# Patient Record
Sex: Female | Born: 1970 | Race: White | Hispanic: No | Marital: Single | Smoking: Never smoker
Health system: Southern US, Community
[De-identification: ages and names within clinical notes are randomized; demographics above are authoritative.]

## PROBLEM LIST (undated history)

## (undated) DIAGNOSIS — IMO0002 Reserved for concepts with insufficient information to code with codable children: Secondary | ICD-10-CM

## (undated) DIAGNOSIS — F329 Major depressive disorder, single episode, unspecified: Secondary | ICD-10-CM

## (undated) DIAGNOSIS — N200 Calculus of kidney: Secondary | ICD-10-CM

## (undated) DIAGNOSIS — F32A Depression, unspecified: Secondary | ICD-10-CM

## (undated) DIAGNOSIS — D649 Anemia, unspecified: Secondary | ICD-10-CM

## (undated) DIAGNOSIS — K909 Intestinal malabsorption, unspecified: Secondary | ICD-10-CM

## (undated) DIAGNOSIS — M810 Age-related osteoporosis without current pathological fracture: Secondary | ICD-10-CM

## (undated) DIAGNOSIS — M549 Dorsalgia, unspecified: Secondary | ICD-10-CM

## (undated) DIAGNOSIS — G8929 Other chronic pain: Secondary | ICD-10-CM

## (undated) DIAGNOSIS — M1990 Unspecified osteoarthritis, unspecified site: Secondary | ICD-10-CM

## (undated) DIAGNOSIS — M419 Scoliosis, unspecified: Secondary | ICD-10-CM

## (undated) HISTORY — PX: TONSILLECTOMY: SUR1361

## (undated) HISTORY — PX: CHOLECYSTECTOMY: SHX55

## (undated) HISTORY — PX: ABDOMINAL HYSTERECTOMY: SHX81

## (undated) HISTORY — PX: ABDOMINAL SURGERY: SHX537

## (undated) HISTORY — DX: Age-related osteoporosis without current pathological fracture: M81.0

## (undated) HISTORY — PX: WRIST SURGERY: SHX841

## (undated) HISTORY — PX: BARIATRIC SURGERY: SHX1103

## (undated) HISTORY — PX: OTHER SURGICAL HISTORY: SHX169

---

## 2011-10-08 ENCOUNTER — Other Ambulatory Visit: Payer: Self-pay | Admitting: Internal Medicine

## 2011-10-08 DIAGNOSIS — M545 Low back pain, unspecified: Secondary | ICD-10-CM

## 2011-10-22 ENCOUNTER — Ambulatory Visit
Admission: RE | Admit: 2011-10-22 | Discharge: 2011-10-22 | Disposition: A | Discharge status: Home / Self Care | Payer: 59 | Source: Ambulatory Visit | Attending: Internal Medicine | Admitting: Internal Medicine

## 2011-10-22 DIAGNOSIS — M545 Low back pain, unspecified: Secondary | ICD-10-CM

## 2012-04-15 ENCOUNTER — Encounter: Payer: Self-pay | Admitting: *Deleted

## 2012-04-15 ENCOUNTER — Emergency Department
Admission: EM | Admit: 2012-04-15 | Discharge: 2012-04-15 | Disposition: A | Discharge status: Home / Self Care | Payer: 59 | Source: Home / Self Care

## 2012-04-15 DIAGNOSIS — S239XXA Sprain of unspecified parts of thorax, initial encounter: Secondary | ICD-10-CM

## 2012-04-15 HISTORY — DX: Anemia, unspecified: D64.9

## 2012-04-15 HISTORY — DX: Major depressive disorder, single episode, unspecified: F32.9

## 2012-04-15 HISTORY — DX: Unspecified osteoarthritis, unspecified site: M19.90

## 2012-04-15 HISTORY — DX: Reserved for concepts with insufficient information to code with codable children: IMO0002

## 2012-04-15 HISTORY — DX: Scoliosis, unspecified: M41.9

## 2012-04-15 HISTORY — DX: Depression, unspecified: F32.A

## 2012-04-15 MED ORDER — KETOROLAC TROMETHAMINE 60 MG/2ML IM SOLN
60.0000 mg | Freq: Once | INTRAMUSCULAR | Status: AC
  Administered 2012-04-15: 60 mg via INTRAMUSCULAR

## 2012-04-15 NOTE — ED Notes (Signed)
Pt c/o mid back pain x 2 days, after moving boxes @ her home.  She has taken tylenol for pain. She has an ortho appt on 04/29/12.

## 2012-04-15 NOTE — ED Provider Notes (Signed)
History     CSN: [id number]  Arrival date & time 04/15/12  1756   First MD Initiated Contact with Patient 04/15/12 1758      Chief Complaint  Patient presents with  . Back Pain  HPI Comments: Pt states that she has a previous history of degenerative disk disease and scoliosis. Pt states that she has an orthopeadic appt with Dr. Loralie Champagne in [place] on 04/29/12. Pt does not have a PCP. She has been getting Nucynta, percocet, and vicodin from her OB-GYN in [place]. Pt lives in [place]. Pt has been getting seen in urgent cares in the  area for her recurrent back pain.   Patient is a 41 y.o. female presenting with back pain.  Back Pain  This is a chronic problem. The current episode started 2 days ago. The problem has not changed since onset.The pain is associated with lifting heavy objects. The pain is present in the thoracic spine. The quality of the pain is described as aching. Radiates to: lower back  The pain is at a severity of 8/10. The pain is moderate. The symptoms are aggravated by certain positions. Pertinent negatives include no chest pain, no fever, no numbness, no abdominal pain, no perianal numbness, no bladder incontinence, no dysuria, no leg pain, no paresthesias, no tingling and no weakness. Treatments tried: tylenol  The treatment provided mild relief.    Past Medical History  Diagnosis Date  . Degenerative disk disease   . Scoliosis   . Arthritis   . Depression   . Anemia     Past Surgical History  Procedure Date  . Abdominal surgery     c-section  . Cholecystectomy   . Tonsillectomy   . Bariatric surgery   . Abdominal hysterectomy     Family History  Problem Relation Age of Onset  . Diabetes Father     History  Substance Use Topics  . Smoking status: Never Smoker   . Smokeless tobacco: Not on file  . Alcohol Use: No    OB History    Grav Para Term Preterm Abortions TAB SAB Ect Mult Living                  Review of Systems    Constitutional: Negative for fever.  Cardiovascular: Negative for chest pain.  Gastrointestinal: Negative for abdominal pain.  Genitourinary: Negative for bladder incontinence and dysuria.  Musculoskeletal: Positive for back pain.  Neurological: Negative for tingling, weakness, numbness and paresthesias.    Allergies  Nsaids and Tramadol  Home Medications   Current Outpatient Rx  Name Route Sig Dispense Refill  . CALCIUM + D PO Oral Take by mouth.    Marland Kitchen VITAMIN D3 3000 UNITS PO TABS Oral Take by mouth.    . ESCITALOPRAM OXALATE 10 MG PO TABS Oral Take 10 mg by mouth daily.      BP 141/87  Pulse 93  Temp(Src) 98.7 F (37.1 C) (Oral)  Resp 18  Ht 5\' 3"  (1.6 m)  Wt 202 lb 8 oz (91.853 kg)  BMI 35.87 kg/m2  SpO2 100%  Physical Exam  Constitutional: She appears well-developed and well-nourished.       In no acute distress   HENT:  Head: Normocephalic and atraumatic.  Eyes: Pupils are equal, round, and reactive to light.  Neck: Normal range of motion. Neck supple.  Cardiovascular: Normal rate and regular rhythm.   Pulmonary/Chest: Effort normal and breath sounds normal.  Musculoskeletal:  Arms: Neurological:       No focal neuro deficits, FABER negative    ED Course  Procedures (including critical care time)  Labs Reviewed - No data to display No results found.   No diagnosis found.    MDM  Thoracic back strain. No narcotic policy and narcotic database records discussed. Toradol 60mg  IM x 1 in clinic (in review of allergies, pt states that she was told not to take NSAIDs 2/2 to bariatric surgery 5 years ago. Pt has not taken NSAID since this point). Scheduled tylenol. Discussed prilosec use for next 48-72 hours for GI protection. Handout given. Follow up as needed.         Floydene Flock, MD 04/15/12 [phone]

## 2012-04-15 NOTE — ED Provider Notes (Signed)
History     CSN: [id number]  Arrival date & time 04/15/12  1756   First MD Initiated Contact with Patient 04/15/12 1758      Chief Complaint  Patient presents with  . Back Pain    (Consider location/radiation/quality/duration/timing/severity/associated sxs/prior treatment) HPI  Past Medical History  Diagnosis Date  . Degenerative disk disease   . Scoliosis   . Arthritis   . Depression   . Anemia     Past Surgical History  Procedure Date  . Abdominal surgery     c-section  . Cholecystectomy   . Tonsillectomy   . Bariatric surgery   . Abdominal hysterectomy     Family History  Problem Relation Age of Onset  . Diabetes Father     History  Substance Use Topics  . Smoking status: Never Smoker   . Smokeless tobacco: Not on file  . Alcohol Use: No    OB History    Grav Para Term Preterm Abortions TAB SAB Ect Mult Living                  Review of Systems  Allergies  Nsaids and Tramadol  Home Medications   Current Outpatient Rx  Name Route Sig Dispense Refill  . CALCIUM + D PO Oral Take by mouth.    Marland Kitchen VITAMIN D3 3000 UNITS PO TABS Oral Take by mouth.    . ESCITALOPRAM OXALATE 10 MG PO TABS Oral Take 10 mg by mouth daily.      BP 141/87  Pulse 93  Temp(Src) 98.7 F (37.1 C) (Oral)  Resp 18  Ht 5\' 3"  (1.6 m)  Wt 202 lb 8 oz (91.853 kg)  BMI 35.87 kg/m2  SpO2 100%  Physical Exam  ED Course  Procedures (including critical care time)  Labs Reviewed - No data to display No results found.   No diagnosis found.    MDM  Pt seen by Dr. Alvester Morin.  See his note for details.    Marlaine Hind, MD 04/15/12 [phone]

## 2012-04-15 NOTE — ED Provider Notes (Signed)
Pt seen by Dr. Alvester Morin.    Marlaine Hind, MD 04/15/12 [phone]

## 2012-04-15 NOTE — Discharge Instructions (Signed)
Thoracic Strain You have injured the muscles or tendons that attach to the upper part of your back behind your chest. This injury is called a thoracic strain, thoracic sprain, or mid-back strain.  CAUSES  The cause of thoracic strain varies. A less severe injury involves pulling a muscle or tendon without tearing it. A more severe injury involves tearing (rupturing) a muscle or tendon. With less severe injuries, there may be little loss of strength. Sometimes, there are breaks (fractures) in the bones to which the muscles are attached. These fractures are rare, unless there was a direct hit (trauma) or you have weak bones due to osteoporosis or age. Longstanding strains may be caused by overuse or improper form during certain movements. Obesity can also increase your risk for back injuries. Sudden strains may occur due to injury or not warming up properly before exercise. Often, there is no obvious cause for a thoracic strain. SYMPTOMS  The main symptom is pain, especially with movement, such as during exercise. DIAGNOSIS  Your caregiver can usually tell what is wrong by taking an X-ray and doing a physical exam. TREATMENT   Physical therapy may be helpful for recovery. Your caregiver can give you exercises to do or refer you to a physical therapist after your pain improves.   After your pain improves, strengthening and conditioning programs appropriate for your sport or occupation may be helpful.   Always warm up before physical activities or athletics. Stretching after physical activity may also help.   Certain over-the-counter medicines may also help. Ask your caregiver if there are medicines that would help you.  If this is your first thoracic strain injury, proper care and proper healing time before starting activities should prevent long-term problems. Torn ligaments and tendons require as long to heal as broken bones. Average healing times may be only 1 week for a mild strain. For torn  muscles and tendons, healing time may be up to 6 weeks to 2 months. HOME CARE INSTRUCTIONS   Apply ice to the injured area. Ice massages may also be used as directed.   Put ice in a plastic bag.   Place a towel between your skin and the bag.   Leave the ice on for 15 to 20 minutes, 3 to 4 times a day, for the first 2 days.   Only take over-the-counter or prescription medicines for pain, discomfort, or fever as directed by your caregiver.   Keep your appointments for physical therapy if this was prescribed.   Use wraps and back braces as instructed.  SEEK IMMEDIATE MEDICAL CARE IF:   You have an increase in bruising, swelling, or pain.   Your pain has not improved with medicines.   You develop new shortness of breath, chest pain, or fever.   Problems seem to be getting worse rather than better.  MAKE SURE YOU:   Understand these instructions.   Will watch your condition.   Will get help right away if you are not doing well or get worse.  Document Released: 02/27/2004 Document Revised: 11/26/2011 Document Reviewed: 01/23/2011 ExitCare Patient Information 2012 [employer]. 

## 2012-06-16 ENCOUNTER — Encounter (HOSPITAL_BASED_OUTPATIENT_CLINIC_OR_DEPARTMENT_OTHER): Payer: Self-pay | Admitting: Emergency Medicine

## 2012-06-16 ENCOUNTER — Emergency Department (HOSPITAL_BASED_OUTPATIENT_CLINIC_OR_DEPARTMENT_OTHER)
Admission: EM | Admit: 2012-06-16 | Discharge: 2012-06-16 | Disposition: A | Discharge status: Home / Self Care | Payer: 59 | Attending: Emergency Medicine | Admitting: Emergency Medicine

## 2012-06-16 ENCOUNTER — Emergency Department (HOSPITAL_BASED_OUTPATIENT_CLINIC_OR_DEPARTMENT_OTHER): Payer: 59

## 2012-06-16 DIAGNOSIS — Z79899 Other long term (current) drug therapy: Secondary | ICD-10-CM | POA: Insufficient documentation

## 2012-06-16 DIAGNOSIS — H9209 Otalgia, unspecified ear: Secondary | ICD-10-CM | POA: Insufficient documentation

## 2012-06-16 DIAGNOSIS — R062 Wheezing: Secondary | ICD-10-CM | POA: Insufficient documentation

## 2012-06-16 DIAGNOSIS — N390 Urinary tract infection, site not specified: Secondary | ICD-10-CM

## 2012-06-16 LAB — URINE MICROSCOPIC-ADD ON

## 2012-06-16 LAB — URINALYSIS, ROUTINE W REFLEX MICROSCOPIC
Glucose, UA: NEGATIVE mg/dL
Ketones, ur: NEGATIVE mg/dL
pH: 5.5 (ref 5.0–8.0)

## 2012-06-16 MED ORDER — ALBUTEROL SULFATE (5 MG/ML) 0.5% IN NEBU
5.0000 mg | INHALATION_SOLUTION | Freq: Once | RESPIRATORY_TRACT | Status: AC
  Administered 2012-06-16: 5 mg via RESPIRATORY_TRACT
  Filled 2012-06-16: qty 1

## 2012-06-16 MED ORDER — HYDROCOD POLST-CHLORPHEN POLST 10-8 MG/5ML PO LQCR: 5.0000 mL | Freq: Two times a day (BID) | ORAL | Status: DC | PRN

## 2012-06-16 MED ORDER — IPRATROPIUM BROMIDE 0.02 % IN SOLN
0.5000 mg | Freq: Once | RESPIRATORY_TRACT | Status: AC
  Administered 2012-06-16: 0.5 mg via RESPIRATORY_TRACT
  Filled 2012-06-16: qty 2.5

## 2012-06-16 MED ORDER — SULFAMETHOXAZOLE-TRIMETHOPRIM 800-160 MG PO TABS: 1.0000 | ORAL_TABLET | Freq: Two times a day (BID) | ORAL | Status: AC

## 2012-06-16 MED ORDER — ANTIPYRINE-BENZOCAINE 5.4-1.4 % OT SOLN
3.0000 [drp] | Freq: Once | OTIC | Status: AC
  Administered 2012-06-16: 3 [drp] via OTIC
  Filled 2012-06-16: qty 10

## 2012-06-16 NOTE — ED Notes (Signed)
Pt diagnosed with bronchitis several weeks ago.  Continues to have symptoms of coughing.  Pt states her right ear hurts x 1 week and she is having left upper abdominal pain since yesterday.  No N/V/D.  Some chills.

## 2012-06-16 NOTE — Discharge Instructions (Signed)

## 2012-06-16 NOTE — ED Provider Notes (Signed)
Medical screening examination/treatment/procedure(s) were performed by non-physician practitioner and as supervising physician I was immediately available for consultation/collaboration.  Orvin Netter T Gearline Spilman, MD 06/16/12 2317 

## 2012-06-16 NOTE — ED Provider Notes (Signed)
History     CSN: [id number]  Arrival date & time 06/16/12  1726   First MD Initiated Contact with Patient 06/16/12 1825      Chief Complaint  Patient presents with  . Abdominal Pain  . Otalgia    (Consider location/radiation/quality/duration/timing/severity/associated sxs/prior treatment) HPI Comments: Pt states that she has been on a z-pack and levaquin for bronchitis:pt states that she gets intermittent relief but then the cough and wheezing seem to come back:pt states that he is now having left lower rib pain:pt states that she has not had a recent fever, but she has had chills  Patient is a 41 y.o. female presenting with cough and ear pain. The history is provided by the patient. No language interpreter was used.  Cough This is a new problem. The current episode started more than 1 week ago. The problem occurs hourly. The problem has not changed since onset.The cough is productive of sputum. The maximum temperature recorded prior to her arrival was 100 to 100.9 F. Associated symptoms include ear pain and wheezing. Pertinent negatives include no sore throat. Her past medical history is significant for bronchitis.  Otalgia This is a new problem. The current episode started more than 1 week ago. There is pain in the right ear. The problem occurs constantly. The problem has not changed since onset.Associated symptoms include cough. Pertinent negatives include no sore throat.    Past Medical History  Diagnosis Date  . Degenerative disk disease   . Scoliosis   . Arthritis   . Depression   . Anemia     Past Surgical History  Procedure Date  . Abdominal surgery     c-section  . Cholecystectomy   . Tonsillectomy   . Bariatric surgery   . Abdominal hysterectomy     Family History  Problem Relation Age of Onset  . Diabetes Father     History  Substance Use Topics  . Smoking status: Never Smoker   . Smokeless tobacco: Not on file  . Alcohol Use: No    OB History    Grav Para Term Preterm Abortions TAB SAB Ect Mult Living                  Review of Systems  Constitutional: Negative.   HENT: Positive for ear pain. Negative for sore throat.   Respiratory: Positive for cough and wheezing.   Cardiovascular: Negative.     Allergies  Nsaids and Tramadol  Home Medications   Current Outpatient Rx  Name Route Sig Dispense Refill  . AZITHROMYCIN 250 MG PO TABS Oral Take 250 mg by mouth daily.    Marland Kitchen CALCIUM + D PO Oral Take by mouth.    Marland Kitchen VITAMIN D3 3000 UNITS PO TABS Oral Take by mouth.    Marland Kitchen CIPROFLOXACIN HCL 500 MG PO TABS Oral Take 500 mg by mouth 2 (two) times daily.    Marland Kitchen ESCITALOPRAM OXALATE 10 MG PO TABS Oral Take 10 mg by mouth daily.    Marland Kitchen HYDROCODONE-ACETAMINOPHEN 5-325 MG PO TABS Oral Take 1 tablet by mouth every 6 (six) hours as needed. Patient used this medication for pain.    Marland Kitchen PREDNISONE 20 MG PO TABS Oral Take 20 mg by mouth daily.      BP 132/82  Pulse 77  Temp 98.6 F (37 C) (Oral)  Resp 22  Ht 5\' 3"  (1.6 m)  Wt 200 lb (90.719 kg)  BMI 35.43 kg/m2  SpO2 100%  Physical Exam  Nursing  note and vitals reviewed. Constitutional: She is oriented to person, place, and time. She appears well-developed and well-nourished.  HENT:  Head: Normocephalic and atraumatic.  Right Ear: External ear normal.  Left Ear: External ear normal.  Mouth/Throat: Oropharynx is clear and moist.  Eyes: EOM are normal.  Neck: Neck supple.  Cardiovascular: Normal rate and regular rhythm.   Pulmonary/Chest: Effort normal.       Pt tender in the left lower ribs  Musculoskeletal: Normal range of motion.  Neurological: She is alert and oriented to person, place, and time.  Skin: Skin is warm and dry.  Psychiatric: She has a normal mood and affect.    ED Course  Procedures (including critical care time)  Labs Reviewed  URINALYSIS, ROUTINE W REFLEX MICROSCOPIC - Abnormal; Notable for the following:    APPearance CLOUDY (*)     Hgb urine dipstick SMALL  (*)     Nitrite POSITIVE (*)     Leukocytes, UA LARGE (*)     All other components within normal limits  URINE MICROSCOPIC-ADD ON - Abnormal; Notable for the following:    Squamous Epithelial / LPF FEW (*)     Bacteria, UA FEW (*)     All other components within normal limits   Dg Chest 2 View  06/16/2012  *RADIOLOGY REPORT*  Clinical Data: Cough, left rib pain  CHEST - 2 VIEW  Comparison: 12/15/1999 set  Findings: Normal heart size, mediastinal contours, and pulmonary vascularity. Lungs clear. No pleural effusion or pneumothorax. No acute osseous abnormalities identified.  IMPRESSION: No acute abnormalities.  Original Report Authenticated By: Lollie Marrow, M.D.     1. UTI (lower urinary tract infection)   2. Otalgia       MDM  Pt abdomen is benign:simple ZOX:WRUE treat symptomatically for the cough        Teressa Lower, NP 06/16/12 2010

## 2012-06-18 LAB — URINE CULTURE: Colony Count: >=100000

## 2012-06-19 NOTE — ED Notes (Signed)
Results received from [facility]. (+) URNC -> >/= 100,000 colonies E Coli.  Rx given in ED for Sulfa-Trimeth -> sensitive to the same.  Chart appended per protocol.

## 2014-08-06 ENCOUNTER — Emergency Department (HOSPITAL_BASED_OUTPATIENT_CLINIC_OR_DEPARTMENT_OTHER)
Admission: EM | Admit: 2014-08-06 | Discharge: 2014-08-06 | Disposition: A | Discharge status: Home / Self Care | Payer: Self-pay | Attending: Emergency Medicine | Admitting: Emergency Medicine

## 2014-08-06 ENCOUNTER — Emergency Department (HOSPITAL_BASED_OUTPATIENT_CLINIC_OR_DEPARTMENT_OTHER): Payer: 59

## 2014-08-06 ENCOUNTER — Encounter (HOSPITAL_BASED_OUTPATIENT_CLINIC_OR_DEPARTMENT_OTHER): Payer: Self-pay | Admitting: Emergency Medicine

## 2014-08-06 DIAGNOSIS — Z90710 Acquired absence of both cervix and uterus: Secondary | ICD-10-CM | POA: Insufficient documentation

## 2014-08-06 DIAGNOSIS — F3289 Other specified depressive episodes: Secondary | ICD-10-CM | POA: Insufficient documentation

## 2014-08-06 DIAGNOSIS — IMO0002 Reserved for concepts with insufficient information to code with codable children: Secondary | ICD-10-CM | POA: Insufficient documentation

## 2014-08-06 DIAGNOSIS — F329 Major depressive disorder, single episode, unspecified: Secondary | ICD-10-CM | POA: Insufficient documentation

## 2014-08-06 DIAGNOSIS — Z9884 Bariatric surgery status: Secondary | ICD-10-CM | POA: Insufficient documentation

## 2014-08-06 DIAGNOSIS — Z9089 Acquired absence of other organs: Secondary | ICD-10-CM | POA: Insufficient documentation

## 2014-08-06 DIAGNOSIS — M4120 Other idiopathic scoliosis, site unspecified: Secondary | ICD-10-CM | POA: Insufficient documentation

## 2014-08-06 DIAGNOSIS — Z79899 Other long term (current) drug therapy: Secondary | ICD-10-CM | POA: Insufficient documentation

## 2014-08-06 DIAGNOSIS — R112 Nausea with vomiting, unspecified: Secondary | ICD-10-CM | POA: Insufficient documentation

## 2014-08-06 DIAGNOSIS — M129 Arthropathy, unspecified: Secondary | ICD-10-CM | POA: Insufficient documentation

## 2014-08-06 DIAGNOSIS — Z862 Personal history of diseases of the blood and blood-forming organs and certain disorders involving the immune mechanism: Secondary | ICD-10-CM | POA: Insufficient documentation

## 2014-08-06 DIAGNOSIS — Z9889 Other specified postprocedural states: Secondary | ICD-10-CM | POA: Insufficient documentation

## 2014-08-06 DIAGNOSIS — Z792 Long term (current) use of antibiotics: Secondary | ICD-10-CM | POA: Insufficient documentation

## 2014-08-06 DIAGNOSIS — R109 Unspecified abdominal pain: Secondary | ICD-10-CM | POA: Insufficient documentation

## 2014-08-06 LAB — CBC WITH DIFFERENTIAL/PLATELET
BASOS ABS: 0 10*3/uL (ref 0.0–0.1)
BASOS PCT: 1 % (ref 0–1)
EOS ABS: 0.1 10*3/uL (ref 0.0–0.7)
Eosinophils Relative: 2 % (ref 0–5)
HEMATOCRIT: 39.3 % (ref 36.0–46.0)
HEMOGLOBIN: 12.9 g/dL (ref 12.0–15.0)
Lymphocytes Relative: 28 % (ref 12–46)
Lymphs Abs: 1.8 10*3/uL (ref 0.7–4.0)
MCH: 29.3 pg (ref 26.0–34.0)
MCHC: 32.8 g/dL (ref 30.0–36.0)
MCV: 89.3 fL (ref 78.0–100.0)
MONO ABS: 0.5 10*3/uL (ref 0.1–1.0)
MONOS PCT: 7 % (ref 3–12)
NEUTROS ABS: 4.1 10*3/uL (ref 1.7–7.7)
Neutrophils Relative %: 63 % (ref 43–77)
Platelets: 255 10*3/uL (ref 150–400)
RBC: 4.4 MIL/uL (ref 3.87–5.11)
RDW: 13.3 % (ref 11.5–15.5)
WBC: 6.5 10*3/uL (ref 4.0–10.5)

## 2014-08-06 LAB — URINALYSIS, ROUTINE W REFLEX MICROSCOPIC
Bilirubin Urine: NEGATIVE
GLUCOSE, UA: NEGATIVE mg/dL
Ketones, ur: NEGATIVE mg/dL
Nitrite: NEGATIVE
PH: 6 (ref 5.0–8.0)
Protein, ur: NEGATIVE mg/dL
SPECIFIC GRAVITY, URINE: 1.025 (ref 1.005–1.030)
Urobilinogen, UA: 0.2 mg/dL (ref 0.0–1.0)

## 2014-08-06 LAB — URINE MICROSCOPIC-ADD ON

## 2014-08-06 LAB — BASIC METABOLIC PANEL
ANION GAP: 12 (ref 5–15)
BUN: 10 mg/dL (ref 6–23)
CALCIUM: 8.7 mg/dL (ref 8.4–10.5)
CHLORIDE: 106 meq/L (ref 96–112)
CO2: 24 mEq/L (ref 19–32)
CREATININE: 0.5 mg/dL (ref 0.50–1.10)
GFR calc non Af Amer: >90 mL/min (ref >90)
Glucose, Bld: 95 mg/dL (ref 70–99)
Potassium: 3.8 mEq/L (ref 3.7–5.3)
Sodium: 142 mEq/L (ref 137–147)

## 2014-08-06 MED ORDER — ONDANSETRON HCL 4 MG/2ML IJ SOLN
INTRAMUSCULAR | Status: DC
Start: 2014-08-06 — End: 2014-08-07
  Filled 2014-08-06: qty 2

## 2014-08-06 MED ORDER — IOHEXOL 300 MG/ML  SOLN
100.0000 mL | Freq: Once | INTRAMUSCULAR | Status: AC | PRN
Start: 2014-08-06 — End: 2014-08-06
  Administered 2014-08-06: 100 mL via INTRAVENOUS

## 2014-08-06 MED ORDER — ONDANSETRON HCL 4 MG/2ML IJ SOLN
4.0000 mg | Freq: Once | INTRAMUSCULAR | Status: AC
  Administered 2014-08-06: 4 mg via INTRAVENOUS

## 2014-08-06 MED ORDER — OXYCODONE-ACETAMINOPHEN 5-325 MG PO TABS
1.0000 | ORAL_TABLET | Freq: Once | ORAL | Status: AC
  Administered 2014-08-06: 1 via ORAL
  Filled 2014-08-06: qty 1

## 2014-08-06 MED ORDER — HYDROMORPHONE HCL PF 1 MG/ML IJ SOLN
0.5000 mg | Freq: Once | INTRAMUSCULAR | Status: AC
Start: 2014-08-06 — End: 2014-08-06
  Administered 2014-08-06: 0.5 mg via INTRAVENOUS
  Filled 2014-08-06: qty 1

## 2014-08-06 MED ORDER — IOHEXOL 300 MG/ML  SOLN
50.0000 mL | Freq: Once | INTRAMUSCULAR | Status: AC | PRN
  Administered 2014-08-06: 50 mL via ORAL

## 2014-08-06 MED ORDER — HYDROMORPHONE HCL PF 1 MG/ML IJ SOLN
1.0000 mg | Freq: Once | INTRAMUSCULAR | Status: AC
Start: 2014-08-06 — End: 2014-08-06
  Administered 2014-08-06: 1 mg via INTRAVENOUS
  Filled 2014-08-06: qty 1

## 2014-08-06 MED ORDER — ONDANSETRON HCL 4 MG PO TABS: 4.0000 mg | ORAL_TABLET | Freq: Four times a day (QID) | ORAL | Status: DC

## 2014-08-06 MED ORDER — OXYCODONE-ACETAMINOPHEN 5-325 MG PO TABS: 1.0000 | ORAL_TABLET | ORAL | Status: DC | PRN

## 2014-08-06 MED ORDER — PROMETHAZINE HCL 25 MG PO TABS: 25.0000 mg | ORAL_TABLET | Freq: Four times a day (QID) | ORAL | Status: DC | PRN

## 2014-08-06 NOTE — Discharge Instructions (Signed)
Flank Pain °Flank pain refers to pain that is located on the side of the body between the upper abdomen and the back. The pain may occur over a short period of time (acute) or may be long-term or reoccurring (chronic). It may be mild or severe. Flank pain can be caused by many things. °CAUSES  °Some of the more common causes of flank pain include: °· Muscle strains.   °· Muscle spasms.   °· A disease of your spine (vertebral disk disease).   °· A lung infection (pneumonia).   °· Fluid around your lungs (pulmonary edema).   °· A kidney infection.   °· Kidney stones.   °· A very painful skin rash caused by the chickenpox virus (shingles).   °· Gallbladder disease.   °HOME CARE INSTRUCTIONS  °Home care will depend on the cause of your pain. In general, °· Rest as directed by your caregiver. °· Drink enough fluids to keep your urine clear or pale yellow. °· Only take over-the-counter or prescription medicines as directed by your caregiver. Some medicines may help relieve the pain. °· Tell your caregiver about any changes in your pain. °· Follow up with your caregiver as directed. °SEEK IMMEDIATE MEDICAL CARE IF:  °· Your pain is not controlled with medicine.   °· You have new or worsening symptoms. °· Your pain increases.   °· You have abdominal pain.   °· You have shortness of breath.   °· You have persistent nausea or vomiting.   °· You have swelling in your abdomen.   °· You feel faint or pass out.   °· You have blood in your urine. °· You have a fever or persistent symptoms for more than 2-3 days. °· You have a fever and your symptoms suddenly get worse. °MAKE SURE YOU:  °· Understand these instructions. °· Will watch your condition. °· Will get help right away if you are not doing well or get worse. °Document Released: 01/28/2006 Document Revised: 08/31/2012 Document Reviewed: 07/21/2012 °ExitCare® Patient Information ©2015 [employer]. This information is not intended to replace advice given to you by your  health care provider. Make sure you discuss any questions you have with your health care provider. ° °

## 2014-08-06 NOTE — ED Provider Notes (Signed)
Medical screening examination/treatment/procedure(s) were conducted as a shared visit with non-physician practitioner(s) and myself.  I personally evaluated the patient during the encounter.   EKG Interpretation None       Doug SouSam Mikell Kazlauskas, MD 08/06/14 2328

## 2014-08-06 NOTE — ED Notes (Signed)
Pt c/o bil flank pain x 1 day denies UTI symptoms

## 2014-08-06 NOTE — ED Provider Notes (Signed)
Complains of bilateral flank pain, right greater than left. Patient last vomited 2 days ago. This likely B. infection she's had in the past. Eye exam nontoxic alert lungs clear auscultation heart regular rate and rhythm abdomen obese, nontender positive left flank tenderness no right flank tenderness extremities no edema skin warm dry no rash  Doug SouSam Eldwin Volkov, MD 08/06/14 1932

## 2014-08-06 NOTE — ED Provider Notes (Signed)
CSN: [id number]     Arrival date & time 08/06/14  1702 History   First MD Initiated Contact with Patient 08/06/14 1731     Chief Complaint  Patient presents with  . Flank Pain     (Consider location/radiation/quality/duration/timing/severity/associated sxs/prior Treatment) Patient is a 43 y.o. female presenting with flank pain. The history is provided by the patient. No language interpreter was used.  Flank Pain This is a new problem. Associated symptoms include nausea and vomiting. Pertinent negatives include no abdominal pain, chills, coughing or fever. Associated symptoms comments: She complains of bilateral flank pain for the past 2 weeks that started as mild discomfort and, today, became significant pain. No fever. She denies urinary symptoms of dysuria or frequency. No known injury. No abdominal pain..    Past Medical History  Diagnosis Date  . Degenerative disk disease   . Scoliosis   . Arthritis   . Depression   . Anemia    Past Surgical History  Procedure Laterality Date  . Abdominal surgery      c-section  . Cholecystectomy    . Tonsillectomy    . Bariatric surgery    . Abdominal hysterectomy     Family History  Problem Relation Age of Onset  . Diabetes Father    History  Substance Use Topics  . Smoking status: Never Smoker   . Smokeless tobacco: Not on file  . Alcohol Use: No   OB History   Grav Para Term Preterm Abortions TAB SAB Ect Mult Living                 Review of Systems  Constitutional: Negative for fever and chills.  Respiratory: Negative.  Negative for cough.   Gastrointestinal: Positive for nausea and vomiting. Negative for abdominal pain and diarrhea.  Genitourinary: Positive for flank pain. Negative for dysuria and frequency.  Musculoskeletal: Negative.   Skin: Negative.   Neurological: Negative.       Allergies  Nsaids and Tramadol  Home Medications   Prior to Admission medications   Medication Sig Start Date End Date Taking?  Authorizing Provider  azithromycin (ZITHROMAX) 250 MG tablet Take 250 mg by mouth daily.    Historical Provider, MD  Calcium Carbonate-Vitamin D (CALCIUM + D PO) Take by mouth.    Historical Provider, MD  chlorpheniramine-HYDROcodone (TUSSIONEX PENNKINETIC ER) 10-8 MG/5ML LQCR Take 5 mLs by mouth every 12 (twelve) hours as needed. 06/16/12   Teressa Lower, NP  Cholecalciferol (VITAMIN D3) 3000 UNITS TABS Take by mouth.    Historical Provider, MD  ciprofloxacin (CIPRO) 500 MG tablet Take 500 mg by mouth 2 (two) times daily.    Historical Provider, MD  escitalopram (LEXAPRO) 10 MG tablet Take 10 mg by mouth daily.    Historical Provider, MD  HYDROcodone-acetaminophen (NORCO) 5-325 MG per tablet Take 1 tablet by mouth every 6 (six) hours as needed. Patient used this medication for pain.    Historical Provider, MD  predniSONE (DELTASONE) 20 MG tablet Take 20 mg by mouth daily.    Historical Provider, MD   BP 135/55  Pulse 73  Temp(Src) 98.4 F (36.9 C) (Oral)  Resp 18  Ht 5\' 3"  (1.6 m)  Wt 210 lb (95.255 kg)  BMI 37.21 kg/m2  SpO2 100% Physical Exam  Constitutional: She is oriented to person, place, and time. She appears well-developed and well-nourished. No distress.  Pulmonary/Chest: Effort normal.  Abdominal: Soft. There is no tenderness. There is no rebound and no guarding.  Genitourinary:  Bilateral flank tenderness.   Neurological: She is alert and oriented to person, place, and time. Coordination normal.  Skin: Skin is warm and dry. No rash noted. No erythema.  Psychiatric: She has a normal mood and affect.    ED Course  Procedures (including critical care time) Labs Review Labs Reviewed  URINALYSIS, ROUTINE W REFLEX MICROSCOPIC - Abnormal; Notable for the following:    Hgb urine dipstick TRACE (*)    Leukocytes, UA SMALL (*)    All other components within normal limits  URINE MICROSCOPIC-ADD ON - Abnormal; Notable for the following:    Crystals CA OXALATE CRYSTALS (*)     All other components within normal limits  BASIC METABOLIC PANEL    Imaging Review No results found.   EKG Interpretation None      MDM   Final diagnoses:  None    1. Flank pain  Pain is significantly improved in ED with medications. No concerning cause for flank pain identified. No UTI - Ca Oxalate stones seen but nephrolithiasis unlikely with bilateral flank pain and no hematuria. No fever to suggest pyelonephritis. No abdominal pain that would be associated with CT findings of gastritis/duodenitis. VSS. Will provide pain management and referral to urology requested by the patient.    Arnoldo HookerShari A Million Maharaj, PA-C 08/06/14 2205

## 2014-08-06 NOTE — ED Notes (Signed)
CT notified pt has completed contrast. 

## 2014-09-20 ENCOUNTER — Encounter (HOSPITAL_BASED_OUTPATIENT_CLINIC_OR_DEPARTMENT_OTHER): Payer: Self-pay | Admitting: Emergency Medicine

## 2014-09-20 ENCOUNTER — Emergency Department (HOSPITAL_BASED_OUTPATIENT_CLINIC_OR_DEPARTMENT_OTHER)
Admission: EM | Admit: 2014-09-20 | Discharge: 2014-09-20 | Disposition: A | Discharge status: Home / Self Care | Payer: 59 | Attending: Emergency Medicine | Admitting: Emergency Medicine

## 2014-09-20 ENCOUNTER — Emergency Department (HOSPITAL_BASED_OUTPATIENT_CLINIC_OR_DEPARTMENT_OTHER): Payer: 59

## 2014-09-20 DIAGNOSIS — S60221A Contusion of right hand, initial encounter: Secondary | ICD-10-CM | POA: Insufficient documentation

## 2014-09-20 DIAGNOSIS — Y9389 Activity, other specified: Secondary | ICD-10-CM | POA: Insufficient documentation

## 2014-09-20 DIAGNOSIS — Z7952 Long term (current) use of systemic steroids: Secondary | ICD-10-CM | POA: Insufficient documentation

## 2014-09-20 DIAGNOSIS — W230XXA Caught, crushed, jammed, or pinched between moving objects, initial encounter: Secondary | ICD-10-CM | POA: Insufficient documentation

## 2014-09-20 DIAGNOSIS — F329 Major depressive disorder, single episode, unspecified: Secondary | ICD-10-CM | POA: Insufficient documentation

## 2014-09-20 DIAGNOSIS — Z862 Personal history of diseases of the blood and blood-forming organs and certain disorders involving the immune mechanism: Secondary | ICD-10-CM | POA: Insufficient documentation

## 2014-09-20 DIAGNOSIS — M1990 Unspecified osteoarthritis, unspecified site: Secondary | ICD-10-CM | POA: Insufficient documentation

## 2014-09-20 DIAGNOSIS — Y929 Unspecified place or not applicable: Secondary | ICD-10-CM | POA: Insufficient documentation

## 2014-09-20 DIAGNOSIS — Z792 Long term (current) use of antibiotics: Secondary | ICD-10-CM | POA: Insufficient documentation

## 2014-09-20 DIAGNOSIS — Z79899 Other long term (current) drug therapy: Secondary | ICD-10-CM | POA: Insufficient documentation

## 2014-09-20 DIAGNOSIS — Z87442 Personal history of urinary calculi: Secondary | ICD-10-CM | POA: Insufficient documentation

## 2014-09-20 HISTORY — DX: Calculus of kidney: N20.0

## 2014-09-20 MED ORDER — HYDROCODONE-ACETAMINOPHEN 5-325 MG PO TABS: 1.0000 | ORAL_TABLET | Freq: Four times a day (QID) | ORAL | Status: DC | PRN

## 2014-09-20 NOTE — ED Provider Notes (Signed)
CSN: [id number]     Arrival date & time 09/20/14  1814 History  This chart was scribed for Shon Batonourtney F Horton, MD by Jolene Provostobert Halas, ED Scribe. This patient was seen in room MH05/MH05 and the patient's care was started at 6:51 PM.    Chief Complaint  Patient presents with  . Hand Injury   HPI HPI Comments: Catherine Gallegos is a 43 y.o. female who presents to the Emergency Department complaining of dull, achy right hand pain as well as bruising that began this morning when her 60 pound pitt bull fell against her hand smashing it into a closing door. Pt states that her pain is a 6-7/10. Pt is using tylenol at home for pain without relief. Pt states that she is allergic to tramadol and Ibuprofen. Pt is right handed.   Past Medical History  Diagnosis Date  . Degenerative disk disease   . Scoliosis   . Arthritis   . Depression   . Anemia   . Kidney stone    Past Surgical History  Procedure Laterality Date  . Abdominal surgery      c-section  . Cholecystectomy    . Tonsillectomy    . Bariatric surgery    . Abdominal hysterectomy     Family History  Problem Relation Age of Onset  . Diabetes Father    History  Substance Use Topics  . Smoking status: Never Smoker   . Smokeless tobacco: Not on file  . Alcohol Use: No   OB History   Grav Para Term Preterm Abortions TAB SAB Ect Mult Living                 Review of Systems  Musculoskeletal:       Right hand pain  Skin: Positive for wound.  All other systems reviewed and are negative.     Allergies  Nsaids and Tramadol  Home Medications   Prior to Admission medications   Medication Sig Start Date End Date Taking? Authorizing Provider  azithromycin (ZITHROMAX) 250 MG tablet Take 250 mg by mouth daily.    Historical Provider, MD  Calcium Carbonate-Vitamin D (CALCIUM + D PO) Take by mouth.    Historical Provider, MD  chlorpheniramine-HYDROcodone (TUSSIONEX PENNKINETIC ER) 10-8 MG/5ML LQCR Take 5 mLs by mouth every 12 (twelve)  hours as needed. 06/16/12   Teressa LowerVrinda Pickering, NP  Cholecalciferol (VITAMIN D3) 3000 UNITS TABS Take by mouth.    Historical Provider, MD  ciprofloxacin (CIPRO) 500 MG tablet Take 500 mg by mouth 2 (two) times daily.    Historical Provider, MD  escitalopram (LEXAPRO) 10 MG tablet Take 10 mg by mouth daily.    Historical Provider, MD  HYDROcodone-acetaminophen (NORCO) 5-325 MG per tablet Take 1 tablet by mouth every 6 (six) hours as needed. Patient used this medication for pain.    Historical Provider, MD  HYDROcodone-acetaminophen (NORCO/VICODIN) 5-325 MG per tablet Take 1 tablet by mouth every 6 (six) hours as needed for moderate pain or severe pain. 09/20/14   Shon Batonourtney F Horton, MD  ondansetron (ZOFRAN) 4 MG tablet Take 1 tablet (4 mg total) by mouth every 6 (six) hours. 08/06/14   Shari A Upstill, PA-C  oxyCODONE-acetaminophen (PERCOCET/ROXICET) 5-325 MG per tablet Take 1-2 tablets by mouth every 4 (four) hours as needed for severe pain. 08/06/14   Shari A Upstill, PA-C  predniSONE (DELTASONE) 20 MG tablet Take 20 mg by mouth daily.    Historical Provider, MD  promethazine (PHENERGAN) 25 MG tablet Take 1  tablet (25 mg total) by mouth every 6 (six) hours as needed for nausea or vomiting. 08/06/14   Melvenia Beam A Upstill, PA-C   BP 143/91  Pulse 87  Temp(Src) 98.7 F (37.1 C) (Oral)  Resp 18  Ht 5\' 3"  (1.6 m)  Wt 210 lb (95.255 kg)  BMI 37.21 kg/m2  SpO2 100% Physical Exam  Nursing note and vitals reviewed. Constitutional: She is oriented to person, place, and time. She appears well-developed and well-nourished. No distress.  HENT:  Head: Normocephalic and atraumatic.  Cardiovascular: Normal rate and regular rhythm.   Pulmonary/Chest: Effort normal. No respiratory distress.  Musculoskeletal:  Focused examination of the right hand reveals bruising over the dorsum of the hand with tenderness to palpation over the second, third, and fourth metacarpals, no snuffbox tenderness, full range of motion of  all fingers and wrists, no obvious deformity, 2+ radial pulse  Neurological: She is alert and oriented to person, place, and time.  Skin: Skin is warm and dry.  Psychiatric: She has a normal mood and affect.    ED Course  Procedures (including critical care time) Labs Review Labs Reviewed - No data to display  Imaging Review Dg Hand Complete Right  09/20/2014   CLINICAL DATA:  43 year old female with hand pain.  Previous injury.  EXAM: RIGHT HAND - COMPLETE 3+ VIEW  COMPARISON:  02/14/2010  FINDINGS: No acute bony abnormality. No significant soft tissue swelling. No radiopaque foreign body. Carpal bones remain aligned. Progressive changes of osteoarthritis, particularly of the first MCP, and the PIP of the index finger.  IMPRESSION: No acute bony abnormality identified.  Signed,  Yvone Neu. Loreta Ave, DO  [facility]  [facility]   Electronically Signed   By: Gilmer Mor O.D.   On: 09/20/2014 19:42     EKG Interpretation None      MDM   Final diagnoses:  Hand contusion, right, initial encounter    Patient presents with injury to right hand. She has obvious bruising of the dorsum of the hand. No obvious deformity. No indication of snuffbox tenderness her occult scaphoid fracture. Plain films are negative. Discuss with patient continuing icing at home and that this is likely a bad contusion. Will provide a short course of Norco given intolerance to ibuprofen. She was instructed not to take this with additional Tylenol.  After history, exam, and medical workup I feel the patient has been appropriately medically screened and is safe for discharge home. Pertinent diagnoses were discussed with the patient. Patient was given return precautions.   I personally performed the services described in this documentation, which was scribed in my presence. The recorded information has been reviewed and is accurate.    Shon Baton, MD 09/20/14  [phone]

## 2014-09-20 NOTE — ED Notes (Signed)
Pt right hand was caught between dog and doorway this am-bruising/swelling noted

## 2014-09-20 NOTE — Discharge Instructions (Signed)
Contusion °A contusion is a deep bruise. Contusions are the result of an injury that caused bleeding under the skin. The contusion may turn blue, purple, or yellow. Minor injuries will give you a painless contusion, but more severe contusions may stay painful and swollen for a few weeks.  °CAUSES  °A contusion is usually caused by a blow, trauma, or direct force to an area of the body. °SYMPTOMS  °· Swelling and redness of the injured area. °· Bruising of the injured area. °· Tenderness and soreness of the injured area. °· Pain. °DIAGNOSIS  °The diagnosis can be made by taking a history and physical exam. An X-ray, CT scan, or MRI may be needed to determine if there were any associated injuries, such as fractures. °TREATMENT  °Specific treatment will depend on what area of the body was injured. In general, the best treatment for a contusion is resting, icing, elevating, and applying cold compresses to the injured area. Over-the-counter medicines may also be recommended for pain control. Ask your caregiver what the best treatment is for your contusion. °HOME CARE INSTRUCTIONS  °· Put ice on the injured area. °¨ Put ice in a plastic bag. °¨ Place a towel between your skin and the bag. °¨ Leave the ice on for 15-20 minutes, 3-4 times a day, or as directed by your health care provider. °· Only take over-the-counter or prescription medicines for pain, discomfort, or fever as directed by your caregiver. Your caregiver may recommend avoiding anti-inflammatory medicines (aspirin, ibuprofen, and naproxen) for 48 hours because these medicines may increase bruising. °· Rest the injured area. °· If possible, elevate the injured area to reduce swelling. °SEEK IMMEDIATE MEDICAL CARE IF:  °· You have increased bruising or swelling. °· You have pain that is getting worse. °· Your swelling or pain is not relieved with medicines. °MAKE SURE YOU:  °· Understand these instructions. °· Will watch your condition. °· Will get help right  away if you are not doing well or get worse. °Document Released: 09/16/2005 Document Revised: 12/12/2013 Document Reviewed: 10/12/2011 °[employer]® Patient Information ©2015 [employer]. This information is not intended to replace advice given to you by your health care provider. Make sure you discuss any questions you have with your health care provider. ° °

## 2014-10-21 ENCOUNTER — Encounter (HOSPITAL_BASED_OUTPATIENT_CLINIC_OR_DEPARTMENT_OTHER): Payer: Self-pay

## 2014-10-21 ENCOUNTER — Emergency Department (HOSPITAL_BASED_OUTPATIENT_CLINIC_OR_DEPARTMENT_OTHER)
Admission: EM | Admit: 2014-10-21 | Discharge: 2014-10-21 | Disposition: A | Discharge status: Home / Self Care | Payer: Self-pay | Attending: Emergency Medicine | Admitting: Emergency Medicine

## 2014-10-21 ENCOUNTER — Emergency Department (HOSPITAL_BASED_OUTPATIENT_CLINIC_OR_DEPARTMENT_OTHER): Payer: 59

## 2014-10-21 DIAGNOSIS — Z8739 Personal history of other diseases of the musculoskeletal system and connective tissue: Secondary | ICD-10-CM | POA: Insufficient documentation

## 2014-10-21 DIAGNOSIS — J209 Acute bronchitis, unspecified: Secondary | ICD-10-CM | POA: Insufficient documentation

## 2014-10-21 DIAGNOSIS — Z87442 Personal history of urinary calculi: Secondary | ICD-10-CM | POA: Insufficient documentation

## 2014-10-21 DIAGNOSIS — R0989 Other specified symptoms and signs involving the circulatory and respiratory systems: Secondary | ICD-10-CM

## 2014-10-21 DIAGNOSIS — R0981 Nasal congestion: Secondary | ICD-10-CM

## 2014-10-21 DIAGNOSIS — Z862 Personal history of diseases of the blood and blood-forming organs and certain disorders involving the immune mechanism: Secondary | ICD-10-CM | POA: Insufficient documentation

## 2014-10-21 DIAGNOSIS — Z8659 Personal history of other mental and behavioral disorders: Secondary | ICD-10-CM | POA: Insufficient documentation

## 2014-10-21 MED ORDER — IPRATROPIUM-ALBUTEROL 0.5-2.5 (3) MG/3ML IN SOLN
3.0000 mL | Freq: Once | RESPIRATORY_TRACT | Status: AC
  Administered 2014-10-21: 3 mL via RESPIRATORY_TRACT

## 2014-10-21 MED ORDER — AZITHROMYCIN 250 MG PO TABS: ORAL_TABLET | ORAL | Status: DC

## 2014-10-21 MED ORDER — PREDNISONE 10 MG PO TABS: 20.0000 mg | ORAL_TABLET | Freq: Every day | ORAL | Status: DC

## 2014-10-21 MED ORDER — HYDROCODONE-ACETAMINOPHEN 5-325 MG PO TABS: ORAL_TABLET | ORAL | Status: DC

## 2014-10-21 MED ORDER — IPRATROPIUM-ALBUTEROL 0.5-2.5 (3) MG/3ML IN SOLN
RESPIRATORY_TRACT | Status: AC
  Filled 2014-10-21: qty 3

## 2014-10-21 MED ORDER — ALBUTEROL SULFATE (2.5 MG/3ML) 0.083% IN NEBU
2.5000 mg | INHALATION_SOLUTION | Freq: Once | RESPIRATORY_TRACT | Status: AC
  Administered 2014-10-21: 2.5 mg via RESPIRATORY_TRACT

## 2014-10-21 MED ORDER — ALBUTEROL SULFATE (2.5 MG/3ML) 0.083% IN NEBU
INHALATION_SOLUTION | RESPIRATORY_TRACT | Status: AC
  Administered 2014-10-21: 2.5 mg via RESPIRATORY_TRACT
  Filled 2014-10-21: qty 3

## 2014-10-21 MED ORDER — IPRATROPIUM-ALBUTEROL 0.5-2.5 (3) MG/3ML IN SOLN
RESPIRATORY_TRACT | Status: AC
  Administered 2014-10-21: 3 mL via RESPIRATORY_TRACT
  Filled 2014-10-21: qty 3

## 2014-10-21 NOTE — ED Notes (Signed)
Patient transported to X-ray 

## 2014-10-21 NOTE — ED Provider Notes (Signed)
CSN: [id number]     Arrival date & time 10/21/14  [id number] History   None    Chief Complaint  Patient presents with  . Nasal Congestion     (Consider location/radiation/quality/duration/timing/severity/associated sxs/prior Treatment) Patient is a 43 y.o. female presenting with URI. The history is provided by the patient.  URI Presenting symptoms: congestion, cough, fatigue and fever   Presenting symptoms: no ear pain  Sore throat: with cough.   Severity:  Moderate Onset quality:  Gradual Duration:  4 days Timing:  Intermittent Progression:  Worsening Chronicity:  New Relieved by:  Nothing Worsened by:  Nothing tried Ineffective treatments:  Decongestant and OTC medications Associated symptoms: headaches (sinus), myalgias, sinus pain and sneezing   Risk factors: no chronic cardiac disease, no chronic respiratory disease, no diabetes mellitus, no recent travel and no sick contacts    Catherine Gallegos is a 43 y.o. female who presents to the ED with cough, cold and congestion that started 4 days ago. She has been taking OTC medications without relief.   Past Medical History  Diagnosis Date  . Degenerative disk disease   . Scoliosis   . Arthritis   . Depression   . Anemia   . Kidney stone    Past Surgical History  Procedure Laterality Date  . Abdominal surgery      c-section  . Cholecystectomy    . Tonsillectomy    . Bariatric surgery    . Abdominal hysterectomy     Family History  Problem Relation Age of Onset  . Diabetes Father    History  Substance Use Topics  . Smoking status: Never Smoker   . Smokeless tobacco: Not on file  . Alcohol Use: No   OB History    No data available     Review of Systems  Constitutional: Positive for fever and fatigue.  HENT: Positive for congestion, sinus pressure and sneezing. Negative for dental problem, ear pain, facial swelling and trouble swallowing. Sore throat: with cough.   Respiratory: Positive for cough.   Gastrointestinal:  Negative for nausea, vomiting, abdominal pain and diarrhea.  Genitourinary: Negative for dysuria, urgency, frequency, decreased urine volume, vaginal bleeding and vaginal discharge.  Musculoskeletal: Positive for myalgias.  Skin: Negative for rash.  Neurological: Positive for headaches (sinus). Negative for syncope and light-headedness.  Psychiatric/Behavioral: Negative for confusion. The patient is not nervous/anxious.       Allergies  Nsaids and Tramadol  Home Medications   Prior to Admission medications   Medication Sig Start Date End Date Taking? Authorizing Provider  Calcium Carbonate-Vitamin D (CALCIUM + D PO) Take by mouth.   Yes Historical Provider, MD  Cholecalciferol (VITAMIN D3) 3000 UNITS TABS Take by mouth.   Yes Historical Provider, MD  HYDROcodone-acetaminophen (NORCO/VICODIN) 5-325 MG per tablet Take 1 tablet by mouth every 6 (six) hours as needed for moderate pain or severe pain. 09/20/14  Yes Shon Batonourtney F Horton, MD  HYDROcodone-acetaminophen (NORCO) 5-325 MG per tablet Take 1 tablet by mouth every 6 (six) hours as needed. Patient used this medication for pain.    Historical Provider, MD   BP 150/80 mmHg  Pulse 85  Temp(Src) 98.5 F (36.9 C) (Oral)  Resp 20  Wt 205 lb (92.987 kg)  SpO2 100% Physical Exam  Constitutional: She is oriented to person, place, and time. She appears well-developed and well-nourished. No distress.  HENT:  Head: Normocephalic and atraumatic.  Right Ear: Tympanic membrane normal.  Left Ear: Tympanic membrane normal.  Nose: Right sinus  exhibits maxillary sinus tenderness. Left sinus exhibits maxillary sinus tenderness.  Mouth/Throat: Uvula is midline and mucous membranes are normal. Posterior oropharyngeal erythema present.  Eyes: Conjunctivae and EOM are normal. Pupils are equal, round, and reactive to light.  Neck: Normal range of motion. Neck supple.  Cardiovascular: Normal rate, regular rhythm and normal heart sounds.    Pulmonary/Chest: Effort normal. No accessory muscle usage. No respiratory distress. She has decreased breath sounds. Wheezes: occasional. She has rhonchi. She has no rales.  Abdominal: Soft. There is no tenderness.  Musculoskeletal: Normal range of motion.  Lymphadenopathy:    She has no cervical adenopathy.  Neurological: She is alert and oriented to person, place, and time. No cranial nerve deficit.  Skin: Skin is warm and dry.  Psychiatric: She has a normal mood and affect. Her behavior is normal.  Nursing note and vitals reviewed.   ED Course  Procedures (including critical care time) Labs Review Labs Reviewed - No data to display  Imaging Review Dg Chest 2 View  10/21/2014   CLINICAL DATA:  Cough, congestion, fever and chest pain.  EXAM: CHEST - 2 VIEW  COMPARISON:  08/22/2014  FINDINGS: The heart size and mediastinal contours are within normal limits. There is no evidence of pulmonary edema, consolidation, pneumothorax, nodule or pleural fluid. The visualized skeletal structures are unremarkable.  IMPRESSION: No active disease.   Electronically Signed   By: Irish LackGlenn  Yamagata M.D.   On: 10/21/2014 10:59   Patient received Duo Neb and states that she feels better.  Continues to have scattered rhonchi.    MDM  43 y.o. female with cough and congestion x 4 days. Using OTC medications without results. She has an inhaler at home she can use if needed. Will treat for bronchitis. She will continue OTC Robitussin and take hydrocodone at night to help stop the cough so she can sleep. She will follow up with her PCP or return here as needed for worsening symptoms. Stable for discharge with O2 SAT 100% on R/A. Discussed with the patient and all questioned fully answered.    Medication List    TAKE these medications        azithromycin 250 MG tablet  Commonly known as:  ZITHROMAX Z-PAK  Take 2 tablets today PO and then one tablet daily     HYDROcodone-acetaminophen 5-325 MG per tablet   Commonly known as:  NORCO/VICODIN  Take one tablet every 4 to 6 hours as needed for cough.     predniSONE 10 MG tablet  Commonly known as:  DELTASONE  Take 2 tablets (20 mg total) by mouth daily.      ASK your doctor about these medications        CALCIUM + D PO  Take by mouth.     Vitamin D3 3000 UNITS Tabs  Take by mouth.          Janne NapoleonHope M Neese, NP 10/21/14 [phone]

## 2014-10-21 NOTE — Discharge Instructions (Signed)
Continue to take Robitussin for cough and add the hydrocodone to try and stop the cough at night so you can sleep. Do not take the hydrocodone if you are driving as it will make you sleepy. Use your inhaler as needed. Follow up with your doctor next week or return here as needed.

## 2014-10-21 NOTE — ED Notes (Addendum)
Patient here with 4 days of cough and congestion, using over the counter meds with minimal relief. Audible congestion noted, speaking full sentences

## 2014-12-04 ENCOUNTER — Encounter (HOSPITAL_BASED_OUTPATIENT_CLINIC_OR_DEPARTMENT_OTHER): Payer: Self-pay | Admitting: *Deleted

## 2014-12-04 ENCOUNTER — Emergency Department (HOSPITAL_BASED_OUTPATIENT_CLINIC_OR_DEPARTMENT_OTHER)
Admission: EM | Admit: 2014-12-04 | Discharge: 2014-12-04 | Disposition: A | Discharge status: Home / Self Care | Payer: Self-pay | Attending: Emergency Medicine | Admitting: Emergency Medicine

## 2014-12-04 ENCOUNTER — Emergency Department (HOSPITAL_BASED_OUTPATIENT_CLINIC_OR_DEPARTMENT_OTHER): Payer: Self-pay

## 2014-12-04 DIAGNOSIS — Z9884 Bariatric surgery status: Secondary | ICD-10-CM | POA: Insufficient documentation

## 2014-12-04 DIAGNOSIS — Z8659 Personal history of other mental and behavioral disorders: Secondary | ICD-10-CM | POA: Insufficient documentation

## 2014-12-04 DIAGNOSIS — M1990 Unspecified osteoarthritis, unspecified site: Secondary | ICD-10-CM | POA: Insufficient documentation

## 2014-12-04 DIAGNOSIS — M7731 Calcaneal spur, right foot: Secondary | ICD-10-CM | POA: Insufficient documentation

## 2014-12-04 DIAGNOSIS — M25571 Pain in right ankle and joints of right foot: Secondary | ICD-10-CM | POA: Insufficient documentation

## 2014-12-04 DIAGNOSIS — M79673 Pain in unspecified foot: Secondary | ICD-10-CM

## 2014-12-04 DIAGNOSIS — Z862 Personal history of diseases of the blood and blood-forming organs and certain disorders involving the immune mechanism: Secondary | ICD-10-CM | POA: Insufficient documentation

## 2014-12-04 DIAGNOSIS — Z792 Long term (current) use of antibiotics: Secondary | ICD-10-CM | POA: Insufficient documentation

## 2014-12-04 DIAGNOSIS — Z87442 Personal history of urinary calculi: Secondary | ICD-10-CM | POA: Insufficient documentation

## 2014-12-04 DIAGNOSIS — Z7952 Long term (current) use of systemic steroids: Secondary | ICD-10-CM | POA: Insufficient documentation

## 2014-12-04 MED ORDER — HYDROCODONE-ACETAMINOPHEN 5-325 MG PO TABS: 1.0000 | ORAL_TABLET | Freq: Four times a day (QID) | ORAL | Status: DC | PRN

## 2014-12-04 NOTE — ED Notes (Signed)
Pt c/o heel and foot pain x 2 days

## 2014-12-04 NOTE — ED Provider Notes (Signed)
CSN: [id number]     Arrival date & time 12/04/14  1632 History   First MD Initiated Contact with Patient 12/04/14 1723     Chief Complaint  Patient presents with  . Foot Pain   HPI  Patient is a 43 year old female with a past medical history of degenerative disc disease, depression, and bariatric surgery who presents emergency room for evaluation of right foot and ankle pain. Patient states that for the last several months she has been having heel pain that is burning, and feels like she is stepping on a nail. She states that it hurts especially with standing. Nothing seems to make it feel better. She has been trying Tylenol at home with little relief. She cannot take ibuprofen due to her bariatric surgery. She states that recently her ankle has started to ache when she is resting her foot in the evenings. She states that she has sustained a for job frequently. She denies any injury that she can think of. Her ankle hurts in the anterior medial side. She has never had injuries to her foot or ankle before. Patient states that she can no longer tolerate this. She states that she has insurance which starts in January. She has never been evaluated by an orthopedist. She denies fevers, nausea, vomiting, redness of the ankle, swelling, or color change.  Past Medical History  Diagnosis Date  . Degenerative disk disease   . Scoliosis   . Arthritis   . Depression   . Anemia   . Kidney stone    Past Surgical History  Procedure Laterality Date  . Abdominal surgery      c-section  . Cholecystectomy    . Tonsillectomy    . Bariatric surgery    . Abdominal hysterectomy     Family History  Problem Relation Age of Onset  . Diabetes Father    History  Substance Use Topics  . Smoking status: Never Smoker   . Smokeless tobacco: Not on file  . Alcohol Use: No   OB History    No data available     Review of Systems  Constitutional: Negative for fever, chills and fatigue.  Respiratory: Negative  for chest tightness and shortness of breath.   Cardiovascular: Negative for chest pain and palpitations.  Gastrointestinal: Negative for nausea, vomiting, diarrhea and constipation.  Musculoskeletal: Positive for arthralgias and gait problem. Negative for joint swelling.  Skin: Negative for color change.  All other systems reviewed and are negative.     Allergies  Nsaids and Tramadol  Home Medications   Prior to Admission medications   Medication Sig Start Date End Date Taking? Authorizing Provider  azithromycin (ZITHROMAX Z-PAK) 250 MG tablet Take 2 tablets today PO and then one tablet daily 10/21/14   Hope Orlene OchM Neese, NP  Calcium Carbonate-Vitamin D (CALCIUM + D PO) Take by mouth.    Historical Provider, MD  Cholecalciferol (VITAMIN D3) 3000 UNITS TABS Take by mouth.    Historical Provider, MD  HYDROcodone-acetaminophen (NORCO/VICODIN) 5-325 MG per tablet Take 1 tablet by mouth every 6 (six) hours as needed for moderate pain or severe pain. 12/04/14   Pina Sirianni A Forcucci, PA-C  predniSONE (DELTASONE) 10 MG tablet Take 2 tablets (20 mg total) by mouth daily. 10/21/14   Hope Orlene OchM Neese, NP   BP 142/68 mmHg  Pulse 84  Temp(Src) 99 F (37.2 C) (Oral)  Resp 18  Ht 5\' 3"  (1.6 m)  Wt 210 lb (95.255 kg)  BMI 37.21 kg/m2  SpO2  100% Physical Exam  Constitutional: She appears well-developed and well-nourished. No distress.  HENT:  Head: Normocephalic and atraumatic.  Mouth/Throat: Oropharynx is clear and moist. No oropharyngeal exudate.  Eyes: Conjunctivae and EOM are normal. Pupils are equal, round, and reactive to light. No scleral icterus.  Neck: Normal range of motion. Neck supple. No JVD present. No thyromegaly present.  Cardiovascular: Normal rate, regular rhythm, normal heart sounds and intact distal pulses.  Exam reveals no gallop and no friction rub.   No murmur heard. Pulses:      Dorsalis pedis pulses are 2+ on the right side, and 2+ on the left side.       Posterior tibial  pulses are 2+ on the right side, and 2+ on the left side.  Negative for bilateral calf tenderness  Pulmonary/Chest: Effort normal and breath sounds normal. No respiratory distress. She has no wheezes. She has no rales. She exhibits no tenderness.  Musculoskeletal:       Right ankle: She exhibits normal range of motion, no swelling, no ecchymosis, no deformity, no laceration and normal pulse. Tenderness. AITFL tenderness found. No lateral malleolus, no medial malleolus, no CF ligament, no posterior TFL, no head of 5th metatarsal and no proximal fibula tenderness found.       Right foot: There is bony tenderness. There is normal range of motion, no tenderness, no swelling, normal capillary refill, no crepitus, no deformity and no laceration.       Feet:  Maximal tenderness to palpation of the calcaneous on the sole of the foot and the medial calcaneous.  Lymphadenopathy:    She has no cervical adenopathy.  Skin: Skin is warm and dry. She is not diaphoretic.  Psychiatric: She has a normal mood and affect. Her behavior is normal. Judgment and thought content normal.  Nursing note and vitals reviewed.   ED Course  Procedures (including critical care time) Labs Review Labs Reviewed - No data to display  Imaging Review Dg Ankle Complete Right  12/04/2014   CLINICAL DATA:  Pain for approximately 1 year.  No recent trauma.  EXAM: RIGHT ANKLE - COMPLETE 3+ VIEW  COMPARISON:  None.  FINDINGS: Frontal, oblique, and lateral views were obtained. There is mild generalized soft tissue swelling. No fracture or dislocation. No effusion. There are small spurs arising from the posterior and inferior calcaneus. Ankle mortise appears intact.  IMPRESSION: Mild generalized soft tissue swelling. There are calcaneal spurs. No fracture. Mortise intact.   Electronically Signed   By: Bretta BangWilliam  Woodruff M.D.   On: 12/04/2014 18:32   Dg Foot Complete Right  12/04/2014   CLINICAL DATA:  Chronic right foot pain without  injury.  EXAM: RIGHT FOOT COMPLETE - 3+ VIEW  COMPARISON:  October 01, 2014.  FINDINGS: There is no evidence of fracture or dislocation. Joint spaces are intact. Stable posterior calcaneal spur is noted. Soft tissues are unremarkable.  IMPRESSION: Stable posterior calcaneal spur. No other significant abnormality seen in the right foot.   Electronically Signed   By: Roque LiasJames  Green M.D.   On: 12/04/2014 18:33     EKG Interpretation None      MDM   Final diagnoses:  Foot pain  Heel spur, right  Ankle pain, right   Patient is a 43 year old female who presents emergency room for evaluation of right foot pain. Physical exam reveals neurovascularly intact right foot. There is tenderness over the calcaneus in the ATF. Suspect that ankle pain is likely related to trying to relieve heel  pain while walking. Believe that the patient might be inverting the foot when walking. Plain film x-ray of the right foot reveals heel spurs with no acute fractures. X-ray of the ankle is negative. Suspect that there may be a mild sprain of the ankle due to walking on the inverted foot. We will place the patient in an ASO brace for comfort and give crutches. Patient cannot take NSAIDs. We'll discharge home with a short course of pain medication. I will also provide crutches. I have recommended that the patient follow-up with orthopedics in January when she gets her insurance. I have also recommended that she use an over-the-counter heel insert. Patient to follow-up with or so. Patient states understanding and agreement at this time. Patient is stable for discharge.    Eben Burow, PA-C 12/04/14 1849  Toy Cookey, MD 12/06/14 1322

## 2014-12-04 NOTE — Discharge Instructions (Signed)
Heel Spur °A heel spur is a hook of bone that can form on the calcaneus (the heel bone and the largest bone of the foot). Heel spurs are often associated with plantar fasciitis and usually come in people who have had the problem for an extended period of time. The cause of the relationship is unknown. The pain associated with them is thought to be caused by an inflammation (soreness and redness) of the plantar fascia rather than the spur itself. The plantar fascia is a thick fibrous like tissue that runs from the calcaneus (heel bone) to the Paulino of the foot. This strong, tight tissue helps maintain the arch of your foot. It helps distribute the weight across your foot as you walk or run. Stresses placed on the plantar fascia can be tremendous. When it is inflamed normal activities become painful. Pain is worse in the morning after sleeping. After sleeping the plantar fascia is tight. The first movements stretch the fascia and this causes pain. As the tendon loosens, the pain usually gets better. It often returns with too much standing or walking.  °About 70% of patients with plantar fasciitis have a heel spur. About half of people without foot pain also have heel spurs. °DIAGNOSIS  °The diagnosis of a heel spur is made by X-ray. The X-ray shows a hook of bone protruding from the bottom of the calcaneus at the point where the plantar fascia is attached to the heel bone.  °TREATMENT °· It is necessary to find out what is causing the stretching of the plantar fascia. If the cause is over-pronation (flat feet), orthotics and proper foot ware may help. °· Stretching exercises, losing weight, wearing shoes that have a cushioned heel that absorbs shock, and elevating the heel with the use of a heel cradle, heel cup, or orthotics may all help. Heel cradles and heel cups provide extra comfort and cushion to the heel, and reduce the amount of shock to the sore area. °AVOIDING THE PAIN OF PLANTAR FASCIITIS AND HEEL  SPURS °· Consult a sports medicine professional before beginning a new exercise program. °· Walking programs offer a good workout. There is a lower chance of overuse injuries common to the runners. There is less impact and less jarring of the joints. °· Begin all new exercise programs slowly. If problems or pains develop, decrease the amount of time or distance until you are at a comfortable level. °· Wear good shoes and replace them regularly. °· Stretch your foot and the heel cords at the back of the ankle (Achilles tendons) both before and after exercise. °· Run or exercise on even surfaces that are not hard. For example, asphalt is better than pavement. °· Do not run barefoot on hard surfaces. °· If using a treadmill, vary the incline. °· Do not continue to workout if you have foot or joint problems. Seek professional help if they do not improve. °HOME CARE INSTRUCTIONS  °· Avoid activities that cause you pain until you recover. °· Use ice or cold packs to the problem or painful areas after working out. °· Only take over-the-counter or prescription medicines for pain, discomfort, or fever as directed by your caregiver. °· Soft shoe inserts or athletic shoes with air or gel sole cushions may be helpful. °· If problems continue or become more severe, consult a sports medicine caregiver. Cortisone is a potent anti-inflammatory medication that may be injected into the painful area. You can discuss this treatment with your caregiver. °MAKE SURE YOU:  °·   Understand these instructions.  Will watch your condition.  Will get help right away if you are not doing well or get worse. Document Released: 01/13/2006 Document Revised: 02/29/2012 Document Reviewed: 02/07/2014 [facility] Patient Information 2015 [place], [place]. This information is not intended to replace advice given to you by your health care provider. Make sure you discuss any questions you have with your health care provider.   Emergency Department  Resource Guide 1) Find a Doctor and Pay Out of Pocket Although you won't have to find out who is covered by your insurance plan, it is a good idea to ask around and get recommendations. You will then need to call the office and see if the doctor you have chosen will accept you as a new patient and what types of options they offer for patients who are self-pay. Some doctors offer discounts or will set up payment plans for their patients who do not have insurance, but you will need to ask so you aren't surprised when you get to your appointment.  2) Contact Your Local Health Department Not all health departments have doctors that can see patients for sick visits, but many do, so it is worth a call to see if yours does. If you don't know where your local health department is, you can check in your phone book. The CDC also has a tool to help you locate your state's health department, and many state websites also have listings of all of their local health departments.  3) Find a Walk-in Clinic If your illness is not likely to be very severe or complicated, you may want to try a walk in clinic. These are popping up all over the country in pharmacies, drugstores, and shopping centers. They're usually staffed by nurse practitioners or physician assistants that have been trained to treat common illnesses and complaints. They're usually fairly quick and inexpensive. However, if you have serious medical issues or chronic medical problems, these are probably not your best option.  No Primary Care Doctor: - Call Health Connect at  [phone] - they can help you locate a primary care doctor that  accepts your insurance, provides certain services, etc. - Physician Referral Service- [phone]  Chronic Pain Problems: Organization         Address  Phone   Notes  [facility]  [phone] Patients need to be referred by their primary care doctor.   Medication Assistance: Organization          Address  Phone   Notes  [facility] Medication [facility] [address] [place], [place] [address] [phone] --Must be a resident of [facility] -- Must have NO insurance coverage whatsoever (no Medicaid/ Medicare, etc.) -- The pt. MUST have a primary care doctor that directs their care regularly and follows them in the community   MedAssist  [phone]   [employer]  [phone]    Agencies that provide inexpensive medical care: Organization         Address  Phone   Notes  [facility]  [phone]   [facility]    [phone]   [facility] [address] [place], [place] [address] [phone]   [facility] [place] [address], [place] [phone]   Planned Parenthood    [phone]   [facility]    [phone](336) [phone]   Community Health and [facility]  [address], [place] Phone:  [phone](336) [phone], Fax:  [phone] Hours of Operation:  9 am - 6 pm, M-F.  Also accepts Medicaid/Medicare and self-pay.  [facility]  [address], [place] Phone: [phone](336) [phone], Fax: [phone]) [phone]. Hours of Operation:  8:30 am - 5:30 pm, M-F.  Also accepts Medicaid and self-pay.  [facility] [place] [address], [place] Phone: [phone](336) [phone]   [facility] [address], [place], [place] [phone], Ext. [phone] Mondays & Thursdays: 7-9 AM.  First 15 patients are seen on a first come, first serve basis.    Medicaid-accepting [facility] [place] Providers:  Organization         Address  Phone   Notes  [facility] [address], [place] [phone]) [phone] Also accepts self-pay patients.  [facility] [address], [place]  [phone]) [phone]   [facility] [address], [place] [phone]   [facility] [address] [place] [address], [place] [phone]) [phone]   Renaye RakersVeita Bland [address], [place]   [phone] Only accepts [place] Access [place]icaid patients after they have their name applied to their card.   Self-Pay (no insurance) in [facility] [place]:  Organization         Address  Phone   Notes  Sickle Cell Patients, [facility] [address], [place] [phone]) [phone]   [facility] [address], [place] [phone]   [facility] [place]  [address], [place] [phone]   [facility]/Dr. Osei-Bonsu  [address] [place] [address], [place] or [id number], [place] [phone] Phone number for both [place] and [place] locations is the same.  [facility] and [facility] [address], [place] [phone]) [phone]   [facility] [address] [place] [address], [place] or [address] [phone](336) [phone]   [facility] [address], [place] [phone]) [phone], phone; [phone]) [phone], fax Sees patients 1st and 3rd Saturday of every month.  Must not qualify for public or private insurance (i.e. Medicaid, Medicare, [place] Health Choice, Veterans' Benefits)  Household income should be no more than 200% of the poverty level The clinic cannot treat you if you are pregnant or think you are pregnant  Sexually transmitted diseases are not treated at the clinic.    Dental Care: Organization         Address  Phone  Notes  [facility] of [facility] [address], [place] [phone]) [phone] Accepts children up to age 41 who are enrolled in [place]icaid or [place] Health Choice; pregnant women with a Medicaid card; and children who have applied for Medicaid or [place] Health Choice, but were declined, whose parents can pay a reduced fee at time of service.  [facility] Department of [facility]  [place]  [address], [place] [phone]) [phone] Accepts children up to age 41 who are enrolled in [place]icaid or [place] Health Choice; pregnant women with a Medicaid card; and children who have applied for Medicaid or  Health Choice, but were declined, whose parents can pay a reduced fee at time of service.  [facility] Access PRO[facility]  [address], [place] [phone](336) [phone]  Patients are seen by appointment only. Walk-ins are not accepted. [facility] will see patients 18 years of age and older. Monday - Tuesday (8am-5pm) Most Wednesdays (8:30-5pm) $30 per visit, cash only  [facility] PRO[facility]  [address], [facility] [phone] Patients are seen by appointment only. Walk-ins are not accepted. [facility] will see patients 41 years of age and older. One Wednesday Evening (Monthly: Volunteer Based).  $30 per visit, cash only  [employer] [facility] [facility]  [phone] for adults; Children under age 314, call Graduate Pediatric Dentistry at [phone]. Children aged 384-14, please call [phone] to request a pediatric application.  Dental services are provided in all areas of dental care including fillings, crowns and bridges, complete and partial dentures, implants, gum treatment, root canals, and extractions. Preventive care is also provided. Treatment is provided to both adults and children. Patients are selected via a lottery and there is often a waiting list.   [facility] [address], [place]  [phone]) [phone] [email or web address]   [facility] [address], [place], [place] [phone], Ext. [phone] Second and Fourth Thursday of each month, opens at 6:30 AM; Clinic ends at 9 AM.  Patients are seen on a first-come first-served basis, and a limited number are seen during each clinic.   [facility]  [address], [place], [place] [phone]) [phone]   Eligibility Requirements You must  have lived in [place], [place], or [place] counties for at least the last three months.   You cannot be eligible for state or federal sponsored National Cityhealthcare insurance, including CIGNAVeterans Administration, [place]ianaMedicaid, or Harrah[employer] EntertainmentMedicare.   You generally cannot be eligible for healthcare insurance through your employer.    How to apply: Eligibility screenings are held every Tuesday and Wednesday afternoon from 1:00 pm until 4:00 pm. You do not need an appointment for the interview!  [facility] [address], [place], [place] [phone]   [facility]  [phone]   [facility]  [phone]   [facility]  [phone]    Behavioral Health Resources in the Community: Intensive Outpatient Programs Organization         Address  Phone  Notes  [facility] [address], [place], [place] [phone]   [facility] Outpatient [address], [place], [place] [phone]   ADS: Alcohol [facility] Drug Sv[facility] [address], [place], [place]  [phone]   [facility] [address],  [place], [place] [phone] or [phone]   Substance Abuse Resources Organization         Address  Phone  Notes  [facility] Drug Services  [phone]   [facility]  [phone]   [facility]  [phone]   [facility]  [phone]   Residential & Outpatient Substance Abuse Program  [phone]   Psychological Services Organization         Address  Phone  Notes  [facility]  [phone]- [phone]   [facility]  [phone]- [phone]   [facility] [address], [place] [phone] or [phone]    Mobile Crisis Teams Organization         Address  Phone  Notes  [facility], Mobile Crisis Care Unit  [phone]   [facility]  [address] [place], [place] [phone]   [facility]  DeEsch [address], [place]  18 [place] [phone]    Self-Help/Support Groups Organization         Address  Phone             Notes  [facility] of [place] - variety of support groups  336- [phone] Call for more information  Narcotics Anonymous (NA), [facility] [address], [place]  2 meetings at this location   Statisticianesidential Treatment Programs Organization         Address  Phone  Notes  [facility] [address],    [place]  [phone]   [facility]  [address], [place] [id number], [place], [place] [phone]   [facility] [address], [place] [phone] Admissions: 8am-3pm M-F  [facility] [address]    [place], [place] [phone]   [facility] [address], [place], [place] [phone]   [facility] [address]  [place], [place] [phone]   [facility] - Intensive Outpatient [address], [place], [place] [phone]   [facility] (Addiction Recovery Care [facility] [address]  [place], [place] [phone] or [phone]   [facility] Services (RTS) [address] [place], [place] [phone] Accepts Medicaid  [facility] [address]  [place] [phone] Substance Abuse/Addiction Treatment   [facility] Behavioral Health Resources Organization         Address  Phone  Notes  [facility]  [phone]   Angie FavaJulie Brannon, PhD [address], [address] [place], [place]   [phone](336) [phone] or [phone]   [facility]   [address] [place], [place] [phone]   [facility] [address], [place], [place] [phone]) [phone] Insurance/Medicaid/sponsorship through [facility]  Faith and Families [address]                                    [place], [place] [phone] Therapy/tele-psych/case  [facility] [address].    [place], [place] [phone]) [phone]    Dr. Lolly MustacheArfeen  [phone]   [facility]  United Way [facility] 1) [address], [place] 2) [address], [place] 3)  [address], [place] [phone]) [phone]  [phone](336) [phone]   [facility] Child Abuse Hotline [phone](336) [phone] or [phone] (After Hours)

## 2015-01-18 ENCOUNTER — Emergency Department (HOSPITAL_BASED_OUTPATIENT_CLINIC_OR_DEPARTMENT_OTHER)
Admission: EM | Admit: 2015-01-18 | Discharge: 2015-01-18 | Disposition: A | Discharge status: Home / Self Care | Payer: Self-pay | Attending: Emergency Medicine | Admitting: Emergency Medicine

## 2015-01-18 ENCOUNTER — Encounter (HOSPITAL_BASED_OUTPATIENT_CLINIC_OR_DEPARTMENT_OTHER): Payer: Self-pay | Admitting: Emergency Medicine

## 2015-01-18 DIAGNOSIS — Z87442 Personal history of urinary calculi: Secondary | ICD-10-CM | POA: Insufficient documentation

## 2015-01-18 DIAGNOSIS — B9689 Other specified bacterial agents as the cause of diseases classified elsewhere: Secondary | ICD-10-CM

## 2015-01-18 DIAGNOSIS — E669 Obesity, unspecified: Secondary | ICD-10-CM | POA: Insufficient documentation

## 2015-01-18 DIAGNOSIS — Z8659 Personal history of other mental and behavioral disorders: Secondary | ICD-10-CM | POA: Insufficient documentation

## 2015-01-18 DIAGNOSIS — Z90710 Acquired absence of both cervix and uterus: Secondary | ICD-10-CM | POA: Insufficient documentation

## 2015-01-18 DIAGNOSIS — M1990 Unspecified osteoarthritis, unspecified site: Secondary | ICD-10-CM | POA: Insufficient documentation

## 2015-01-18 DIAGNOSIS — N760 Acute vaginitis: Secondary | ICD-10-CM | POA: Insufficient documentation

## 2015-01-18 DIAGNOSIS — Z9049 Acquired absence of other specified parts of digestive tract: Secondary | ICD-10-CM | POA: Insufficient documentation

## 2015-01-18 DIAGNOSIS — Z862 Personal history of diseases of the blood and blood-forming organs and certain disorders involving the immune mechanism: Secondary | ICD-10-CM | POA: Insufficient documentation

## 2015-01-18 DIAGNOSIS — R102 Pelvic and perineal pain: Secondary | ICD-10-CM | POA: Insufficient documentation

## 2015-01-18 HISTORY — DX: Calculus of kidney: N20.0

## 2015-01-18 LAB — URINALYSIS, ROUTINE W REFLEX MICROSCOPIC
Bilirubin Urine: NEGATIVE
GLUCOSE, UA: NEGATIVE mg/dL
KETONES UR: NEGATIVE mg/dL
Nitrite: NEGATIVE
Protein, ur: NEGATIVE mg/dL
SPECIFIC GRAVITY, URINE: 1.024 (ref 1.005–1.030)
Urobilinogen, UA: 0.2 mg/dL (ref 0.0–1.0)
pH: 6 (ref 5.0–8.0)

## 2015-01-18 LAB — URINE MICROSCOPIC-ADD ON

## 2015-01-18 LAB — WET PREP, GENITAL
Trich, Wet Prep: NONE SEEN
Yeast Wet Prep HPF POC: NONE SEEN

## 2015-01-18 MED ORDER — HYDROCODONE-ACETAMINOPHEN 5-325 MG PO TABS
2.0000 | ORAL_TABLET | Freq: Once | ORAL | Status: AC
  Administered 2015-01-18: 2 via ORAL
  Filled 2015-01-18: qty 2

## 2015-01-18 MED ORDER — HYDROCODONE-ACETAMINOPHEN 5-325 MG PO TABS: 1.0000 | ORAL_TABLET | ORAL | Status: DC | PRN

## 2015-01-18 MED ORDER — METRONIDAZOLE 500 MG PO TABS: 500.0000 mg | ORAL_TABLET | Freq: Two times a day (BID) | ORAL | Status: DC

## 2015-01-18 NOTE — ED Notes (Signed)
Pelvic pain x 2 weeks   Worse today  Denies othersx

## 2015-01-18 NOTE — ED Provider Notes (Signed)
CSN: [id number]     Arrival date & time 01/18/15  1856 History   First MD Initiated Contact with Patient 01/18/15 1902     Chief Complaint  Patient presents with  . Pelvic Pain     (Consider location/radiation/quality/duration/timing/severity/associated sxs/prior Treatment) HPI Comments: 44 year old obese female presenting to the ED complaining of gradual onset pelvic pain 2 weeks. Pain has been intermittent over the past 2 weeks, however today became constant. Pain located in the right side of her lower pelvic region, nonradiating. History of total abdominal hysterectomy, cholecystectomy, bariatric surgery. Denies fever, chills, nausea, vomiting, increased urinary frequency, urgency, dysuria, hematuria, vaginal bleeding or discharge. She has tried taking ibuprofen with no pain relief.  Patient is a 44 y.o. female presenting with pelvic pain. The history is provided by the patient.  Pelvic Pain    Past Medical History  Diagnosis Date  . Degenerative disk disease   . Scoliosis   . Arthritis   . Depression   . Anemia   . Kidney stone   . Kidney stones    Past Surgical History  Procedure Laterality Date  . Abdominal surgery      c-section  . Cholecystectomy    . Tonsillectomy    . Bariatric surgery    . Abdominal hysterectomy    . C sectiions    . Rectal fiscure     Family History  Problem Relation Age of Onset  . Diabetes Father    History  Substance Use Topics  . Smoking status: Never Smoker   . Smokeless tobacco: Not on file  . Alcohol Use: No   OB History    No data available     Review of Systems  Genitourinary: Positive for pelvic pain.  All other systems reviewed and are negative.     Allergies  Morphine and related; Gabapentin; Nsaids; and Tramadol  Home Medications   Prior to Admission medications   Medication Sig Start Date End Date Taking? Authorizing Provider  Calcium Carbonate-Vitamin D (CALCIUM + D PO) Take by mouth.    Historical  Provider, MD  Cholecalciferol (VITAMIN D3) 3000 UNITS TABS Take by mouth.    Historical Provider, MD  HYDROcodone-acetaminophen (NORCO/VICODIN) 5-325 MG per tablet Take 1-2 tablets by mouth every 4 (four) hours as needed. 01/18/15   Bowyn Mercier M Rishit Burkhalter, PA-C  metroNIDAZOLE (FLAGYL) 500 MG tablet Take 1 tablet (500 mg total) by mouth 2 (two) times daily. One po bid x 7 days 01/18/15   Kathrynn Speed, PA-C   BP 112/73 mmHg  Pulse 80  Temp(Src) 97.9 F (36.6 C) (Oral)  Resp 20  Ht  (1.6 m)  Wt 240 lb (108.863 kg)  BMI 42.52 kg/m2  SpO2 100% Physical Exam  Constitutional: She is oriented to person, place, and time. She appears well-developed and well-nourished. No distress.  Obese.  HENT:  Head: Normocephalic and atraumatic.  Mouth/Throat: Oropharynx is clear and moist.  Eyes: Conjunctivae and EOM are normal.  Neck: Normal range of motion. Neck supple.  Cardiovascular: Normal rate, regular rhythm and normal heart sounds.   Pulmonary/Chest: Effort normal and breath sounds normal. No respiratory distress.  Abdominal: There is no rigidity, no rebound and no guarding.    Obese abdomen. No peritonea signs. No rashes.  Genitourinary: There is tenderness in the vagina. No bleeding in the vagina. Vaginal discharge (white, malodorous) found.  Cervix, uterus and ovaries surgically absent.  Musculoskeletal: Normal range of motion. She exhibits no edema.  Neurological: She is alert  and oriented to person, place, and time. No sensory deficit.  Skin: Skin is warm and dry.  Psychiatric: She has a normal mood and affect. Her behavior is normal.  Nursing note and vitals reviewed.   ED Course  Procedures (including critical care time) Labs Review Labs Reviewed  WET PREP, GENITAL - Abnormal; Notable for the following:    Clue Cells Wet Prep HPF POC MODERATE (*)    WBC, Wet Prep HPF POC FEW (*)    All other components within normal limits  URINALYSIS, ROUTINE W REFLEX MICROSCOPIC - Abnormal; Notable  for the following:    APPearance CLOUDY (*)    Hgb urine dipstick MODERATE (*)    Leukocytes, UA SMALL (*)    All other components within normal limits  URINE MICROSCOPIC-ADD ON - Abnormal; Notable for the following:    Squamous Epithelial / LPF FEW (*)    Bacteria, UA FEW (*)    All other components within normal limits    Imaging Review No results found.   EKG Interpretation None      MDM   Final diagnoses:  BV (bacterial vaginosis)  Pelvic pain in female   Patient in no apparent distress. Afebrile, vital signs stable. Abdomen is soft with tenderness as shown above. No. Snail signs. History of total abdominal hysterectomy. PID unlikely. Wet prep positive for clue cells. Urinalysis with 3-6 white blood cells, small leuks, few bacteria. Will send urine culture. No urinary symptoms. Hold on antibiotic treatment for UTI at this time. Will discharge home with Flagyl and Vicodin. Pt states morphine allergy has no issue with Vicodin. Follow-up with PCP. Stable for discharge. Return precautions given. Patient states understanding of treatment care plan and is agreeable.   Kathrynn SpeedRobyn M Inetha Maret, PA-C 01/18/15 2017  Glynn OctaveStephen Rancour, MD 01/19/15 [phone]

## 2015-01-18 NOTE — Discharge Instructions (Signed)
Take Flagyl twice daily for 1 week. Take Vicodin for severe pain only. No driving or operating heavy machinery while taking vicodin. This medication may cause drowsiness. Follow-up with your primary care physician.  Pelvic Pain Female pelvic pain can be caused by many different things and start from a variety of places. Pelvic pain refers to pain that is located in the lower half of the abdomen and between your hips. The pain may occur over a short period of time (acute) or may be reoccurring (chronic). The cause of pelvic pain may be related to disorders affecting the female reproductive organs (gynecologic), but it may also be related to the bladder, kidney stones, an intestinal complication, or muscle or skeletal problems. Getting help right away for pelvic pain is important, especially if there has been severe, sharp, or a sudden onset of unusual pain. It is also important to get help right away because some types of pelvic pain can be life threatening.  CAUSES  Below are only some of the causes of pelvic pain. The causes of pelvic pain can be in one of several categories.   Gynecologic.  Pelvic inflammatory disease.  Sexually transmitted infection.  Ovarian cyst or a twisted ovarian ligament (ovarian torsion).  Uterine lining that grows outside the uterus (endometriosis).  Fibroids, cysts, or tumors.  Ovulation.  Pregnancy.  Pregnancy that occurs outside the uterus (ectopic pregnancy).  Miscarriage.  Labor.  Abruption of the placenta or ruptured uterus.  Infection.  Uterine infection (endometritis).  Bladder infection.  Diverticulitis.  Miscarriage related to a uterine infection (septic abortion).  Bladder.  Inflammation of the bladder (cystitis).  Kidney stone(s).  Gastrointestinal.  Constipation.  Diverticulitis.  Neurologic.  Trauma.  Feeling pelvic pain because of mental or emotional causes (psychosomatic).  Cancers of the bowel or  pelvis. EVALUATION  Your caregiver will want to take a careful history of your concerns. This includes recent changes in your health, a careful gynecologic history of your periods (menses), and a sexual history. Obtaining your family history and medical history is also important. Your caregiver may suggest a pelvic exam. A pelvic exam will help identify the location and severity of the pain. It also helps in the evaluation of which organ system may be involved. In order to identify the cause of the pelvic pain and be properly treated, your caregiver may order tests. These tests may include:   A pregnancy test.  Pelvic ultrasonography.  An X-ray exam of the abdomen.  A urinalysis or evaluation of vaginal discharge.  Blood tests. HOME CARE INSTRUCTIONS   Only take over-the-counter or prescription medicines for pain, discomfort, or fever as directed by your caregiver.   Rest as directed by your caregiver.   Eat a balanced diet.   Drink enough fluids to make your urine clear or pale yellow, or as directed.   Avoid sexual intercourse if it causes pain.   Apply warm or cold compresses to the lower abdomen depending on which one helps the pain.   Avoid stressful situations.   Keep a journal of your pelvic pain. Write down when it started, where the pain is located, and if there are things that seem to be associated with the pain, such as food or your menstrual cycle.  Follow up with your caregiver as directed.  SEEK MEDICAL CARE IF:  Your medicine does not help your pain.  You have abnormal vaginal discharge. SEEK IMMEDIATE MEDICAL CARE IF:   You have heavy bleeding from the vagina.  Your pelvic pain increases.   You feel light-headed or faint.   You have chills.   You have pain with urination or blood in your urine.   You have uncontrolled diarrhea or vomiting.   You have a fever or persistent symptoms for more than 3 days.  You have a fever and your  symptoms suddenly get worse.   You are being physically or sexually abused.  MAKE SURE YOU:  Understand these instructions.  Will watch your condition.  Will get help if you are not doing well or get worse. Document Released: 11/03/2004 Document Revised: 04/23/2014 Document Reviewed: 03/28/2012 [facility] Patient Information 2015 [place], [place]. This information is not intended to replace advice given to you by your health care provider. Make sure you discuss any questions you have with your health care provider.  Bacterial Vaginosis Bacterial vaginosis is a vaginal infection that occurs when the normal balance of bacteria in the vagina is disrupted. It results from an overgrowth of certain bacteria. This is the most common vaginal infection in women of childbearing age. Treatment is important to prevent complications, especially in pregnant women, as it can cause a premature delivery. CAUSES  Bacterial vaginosis is caused by an increase in harmful bacteria that are normally present in smaller amounts in the vagina. Several different kinds of bacteria can cause bacterial vaginosis. However, the reason that the condition develops is not fully understood. RISK FACTORS Certain activities or behaviors can put you at an increased risk of developing bacterial vaginosis, including:  Having a new sex partner or multiple sex partners.  Douching.  Using an intrauterine device (IUD) for contraception. Women do not get bacterial vaginosis from toilet seats, bedding, swimming pools, or contact with objects around them. SIGNS AND SYMPTOMS  Some women with bacterial vaginosis have no signs or symptoms. Common symptoms include:  Grey vaginal discharge.  A fishlike odor with discharge, especially after sexual intercourse.  Itching or burning of the vagina and vulva.  Burning or pain with urination. DIAGNOSIS  Your health care provider will take a medical history and examine the vagina for signs  of bacterial vaginosis. A sample of vaginal fluid may be taken. Your health care provider will look at this sample under a microscope to check for bacteria and abnormal cells. A vaginal pH test may also be done.  TREATMENT  Bacterial vaginosis may be treated with antibiotic medicines. These may be given in the form of a pill or a vaginal cream. A second round of antibiotics may be prescribed if the condition comes back after treatment.  HOME CARE INSTRUCTIONS   Only take over-the-counter or prescription medicines as directed by your health care provider.  If antibiotic medicine was prescribed, take it as directed. Make sure you finish it even if you start to feel better.  Do not have sex until treatment is completed.  Tell all sexual partners that you have a vaginal infection. They should see their health care provider and be treated if they have problems, such as a mild rash or itching.  Practice safe sex by using condoms and only having one sex partner. SEEK MEDICAL CARE IF:   Your symptoms are not improving after 3 days of treatment.  You have increased discharge or pain.  You have a fever. MAKE SURE YOU:   Understand these instructions.  Will watch your condition.  Will get help right away if you are not doing well or get worse. FOR MORE INFORMATION  Centers for Disease Control and  Prevention, Division of STD Prevention: [employer] American Sexual Health Association (ASHA): www.ashastd.org  Document Released: 12/07/2005 Document Revised: 09/27/2013 Document Reviewed: 07/19/2013 [facility] Patient Information 2015 [place], [place]. This information is not intended to replace advice given to you by your health care provider. Make sure you discuss any questions you have with your health care provider.

## 2015-01-21 ENCOUNTER — Encounter (HOSPITAL_BASED_OUTPATIENT_CLINIC_OR_DEPARTMENT_OTHER): Payer: Self-pay

## 2015-01-21 ENCOUNTER — Emergency Department (HOSPITAL_BASED_OUTPATIENT_CLINIC_OR_DEPARTMENT_OTHER)
Admission: EM | Admit: 2015-01-21 | Discharge: 2015-01-21 | Disposition: A | Discharge status: Home / Self Care | Payer: Self-pay | Attending: Emergency Medicine | Admitting: Emergency Medicine

## 2015-01-21 ENCOUNTER — Emergency Department (HOSPITAL_BASED_OUTPATIENT_CLINIC_OR_DEPARTMENT_OTHER): Payer: Self-pay

## 2015-01-21 DIAGNOSIS — R1110 Vomiting, unspecified: Secondary | ICD-10-CM

## 2015-01-21 DIAGNOSIS — Z792 Long term (current) use of antibiotics: Secondary | ICD-10-CM | POA: Insufficient documentation

## 2015-01-21 DIAGNOSIS — R197 Diarrhea, unspecified: Secondary | ICD-10-CM | POA: Insufficient documentation

## 2015-01-21 DIAGNOSIS — Z9089 Acquired absence of other organs: Secondary | ICD-10-CM | POA: Insufficient documentation

## 2015-01-21 DIAGNOSIS — Z8659 Personal history of other mental and behavioral disorders: Secondary | ICD-10-CM | POA: Insufficient documentation

## 2015-01-21 DIAGNOSIS — N390 Urinary tract infection, site not specified: Secondary | ICD-10-CM | POA: Insufficient documentation

## 2015-01-21 DIAGNOSIS — R1031 Right lower quadrant pain: Secondary | ICD-10-CM

## 2015-01-21 DIAGNOSIS — R6883 Chills (without fever): Secondary | ICD-10-CM | POA: Insufficient documentation

## 2015-01-21 DIAGNOSIS — M1990 Unspecified osteoarthritis, unspecified site: Secondary | ICD-10-CM | POA: Insufficient documentation

## 2015-01-21 DIAGNOSIS — R945 Abnormal results of liver function studies: Secondary | ICD-10-CM | POA: Insufficient documentation

## 2015-01-21 DIAGNOSIS — R7989 Other specified abnormal findings of blood chemistry: Secondary | ICD-10-CM

## 2015-01-21 DIAGNOSIS — R110 Nausea: Secondary | ICD-10-CM | POA: Insufficient documentation

## 2015-01-21 DIAGNOSIS — Z87442 Personal history of urinary calculi: Secondary | ICD-10-CM | POA: Insufficient documentation

## 2015-01-21 DIAGNOSIS — Z90710 Acquired absence of both cervix and uterus: Secondary | ICD-10-CM | POA: Insufficient documentation

## 2015-01-21 LAB — URINALYSIS, ROUTINE W REFLEX MICROSCOPIC
Bilirubin Urine: NEGATIVE
GLUCOSE, UA: NEGATIVE mg/dL
Ketones, ur: NEGATIVE mg/dL
Nitrite: NEGATIVE
PH: 5 (ref 5.0–8.0)
PROTEIN: NEGATIVE mg/dL
SPECIFIC GRAVITY, URINE: 1.027 (ref 1.005–1.030)
Urobilinogen, UA: 0.2 mg/dL (ref 0.0–1.0)

## 2015-01-21 LAB — CBC WITH DIFFERENTIAL/PLATELET
BASOS ABS: 0 10*3/uL (ref 0.0–0.1)
Basophils Relative: 1 % (ref 0–1)
Eosinophils Absolute: 0.1 10*3/uL (ref 0.0–0.7)
Eosinophils Relative: 2 % (ref 0–5)
HEMATOCRIT: 47.1 % — AB (ref 36.0–46.0)
Hemoglobin: 15.4 g/dL — ABNORMAL HIGH (ref 12.0–15.0)
LYMPHS ABS: 1.5 10*3/uL (ref 0.7–4.0)
LYMPHS PCT: 31 % (ref 12–46)
MCH: 29 pg (ref 26.0–34.0)
MCHC: 32.7 g/dL (ref 30.0–36.0)
MCV: 88.7 fL (ref 78.0–100.0)
Monocytes Absolute: 0.3 10*3/uL (ref 0.1–1.0)
Monocytes Relative: 6 % (ref 3–12)
NEUTROS ABS: 2.9 10*3/uL (ref 1.7–7.7)
Neutrophils Relative %: 60 % (ref 43–77)
PLATELETS: 327 10*3/uL (ref 150–400)
RBC: 5.31 MIL/uL — AB (ref 3.87–5.11)
RDW: 13 % (ref 11.5–15.5)
WBC: 4.8 10*3/uL (ref 4.0–10.5)

## 2015-01-21 LAB — COMPREHENSIVE METABOLIC PANEL
ALBUMIN: 4 g/dL (ref 3.5–5.2)
ALT: 38 U/L — ABNORMAL HIGH (ref 0–35)
ANION GAP: 3 — AB (ref 5–15)
AST: 53 U/L — AB (ref 0–37)
Alkaline Phosphatase: 148 U/L — ABNORMAL HIGH (ref 39–117)
BILIRUBIN TOTAL: 0.8 mg/dL (ref 0.3–1.2)
BUN: 8 mg/dL (ref 6–23)
CO2: 26 mmol/L (ref 19–32)
Calcium: 9 mg/dL (ref 8.4–10.5)
Chloride: 108 mmol/L (ref 96–112)
Creatinine, Ser: 0.54 mg/dL (ref 0.50–1.10)
GLUCOSE: 102 mg/dL — AB (ref 70–99)
Potassium: 4.1 mmol/L (ref 3.5–5.1)
SODIUM: 137 mmol/L (ref 135–145)
Total Protein: 8 g/dL (ref 6.0–8.3)

## 2015-01-21 LAB — URINE MICROSCOPIC-ADD ON

## 2015-01-21 LAB — LIPASE, BLOOD: Lipase: 38 U/L (ref 11–59)

## 2015-01-21 MED ORDER — ONDANSETRON 4 MG PO TBDP: 4.0000 mg | ORAL_TABLET | Freq: Three times a day (TID) | ORAL | Status: DC | PRN

## 2015-01-21 MED ORDER — ONDANSETRON HCL 4 MG/2ML IJ SOLN
4.0000 mg | Freq: Once | INTRAMUSCULAR | Status: AC
  Administered 2015-01-21: 4 mg via INTRAVENOUS
  Filled 2015-01-21: qty 2

## 2015-01-21 MED ORDER — IOHEXOL 300 MG/ML  SOLN
100.0000 mL | Freq: Once | INTRAMUSCULAR | Status: AC | PRN
  Administered 2015-01-21: 100 mL via INTRAVENOUS

## 2015-01-21 MED ORDER — OXYCODONE-ACETAMINOPHEN 5-325 MG PO TABS
1.0000 | ORAL_TABLET | Freq: Once | ORAL | Status: AC
  Administered 2015-01-21: 1 via ORAL
  Filled 2015-01-21: qty 1

## 2015-01-21 MED ORDER — IOHEXOL 300 MG/ML  SOLN
25.0000 mL | Freq: Once | INTRAMUSCULAR | Status: AC | PRN
Start: 2015-01-21 — End: 2015-01-21
  Administered 2015-01-21: 25 mL via ORAL

## 2015-01-21 MED ORDER — CEPHALEXIN 500 MG PO CAPS: 500.0000 mg | ORAL_CAPSULE | Freq: Two times a day (BID) | ORAL | Status: DC

## 2015-01-21 MED ORDER — SODIUM CHLORIDE 0.9 % IV BOLUS (SEPSIS)
1000.0000 mL | Freq: Once | INTRAVENOUS | Status: AC
  Administered 2015-01-21: 1000 mL via INTRAVENOUS

## 2015-01-21 NOTE — ED Notes (Signed)
Reports increased pain since last visit. Also sts nausea and vomiting since leaving here on Friday. Sts pain worsens when she urinates.

## 2015-01-21 NOTE — ED Provider Notes (Signed)
CSN: [id number]     Arrival date & time 01/21/15  4098 History   First MD Initiated Contact with Patient 01/21/15 [phone]     Chief Complaint  Patient presents with  . Abdominal Pain     (Consider location/radiation/quality/duration/timing/severity/associated sxs/prior Treatment) HPI Comments: Pain seems worse with urination. States that she was seen 2 days ago in the er and diagnosed with bv and given flagyl and pain medication and the symptoms have only worsened  Patient is a 44 y.o. female presenting with abdominal pain. The history is provided by the patient. No language interpreter was used.  Abdominal Pain Pain location:  RLQ Pain quality: aching   Pain radiates to:  Does not radiate Pain severity:  Moderate Onset quality:  Gradual Timing:  Constant Progression:  Worsening Chronicity:  New Context: not alcohol use   Relieved by:  Nothing Associated symptoms: chills, diarrhea, nausea and vomiting   Associated symptoms: no fever     Past Medical History  Diagnosis Date  . Degenerative disk disease   . Scoliosis   . Arthritis   . Depression   . Anemia   . Kidney stone   . Kidney stones    Past Surgical History  Procedure Laterality Date  . Abdominal surgery      c-section  . Cholecystectomy    . Tonsillectomy    . Bariatric surgery    . Abdominal hysterectomy    . C sectiions    . Rectal fiscure     Family History  Problem Relation Age of Onset  . Diabetes Father    History  Substance Use Topics  . Smoking status: Never Smoker   . Smokeless tobacco: Not on file  . Alcohol Use: No   OB History    No data available     Review of Systems  Constitutional: Positive for chills. Negative for fever.  Gastrointestinal: Positive for nausea, vomiting, abdominal pain and diarrhea.  All other systems reviewed and are negative.     Allergies  Morphine and related; Gabapentin; Nsaids; and Tramadol  Home Medications   Prior to Admission medications    Medication Sig Start Date End Date Taking? Authorizing Provider  Calcium Carbonate-Vitamin D (CALCIUM + D PO) Take by mouth.    Historical Provider, MD  Cholecalciferol (VITAMIN D3) 3000 UNITS TABS Take by mouth.    Historical Provider, MD  HYDROcodone-acetaminophen (NORCO/VICODIN) 5-325 MG per tablet Take 1-2 tablets by mouth every 4 (four) hours as needed. 01/18/15   Robyn M Hess, PA-C  metroNIDAZOLE (FLAGYL) 500 MG tablet Take 1 tablet (500 mg total) by mouth 2 (two) times daily. One po bid x 7 days 01/18/15   Nada Boozer Hess, PA-C   BP 147/83 mmHg  Pulse 83  Temp(Src) 98.2 F (36.8 C) (Oral)  Resp 16  Ht  (1.6 m)  Wt 231 lb 4.8 oz (104.917 kg)  BMI 40.98 kg/m2  SpO2 100% Physical Exam  Constitutional: She is oriented to person, place, and time. She appears well-developed and well-nourished.  Cardiovascular: Normal rate and regular rhythm.   Pulmonary/Chest: Effort normal and breath sounds normal.  Abdominal: Soft. Bowel sounds are normal. There is tenderness in the right lower quadrant.  Musculoskeletal: Normal range of motion.  Neurological: She is alert and oriented to person, place, and time.  Skin: Skin is warm and dry.  Psychiatric: She has a normal mood and affect.  Nursing note and vitals reviewed.   ED Course  Procedures (including critical care  time) Labs Review Labs Reviewed  COMPREHENSIVE METABOLIC PANEL - Abnormal; Notable for the following:    Glucose, Bld 102 (*)    AST 53 (*)    ALT 38 (*)    Alkaline Phosphatase 148 (*)    Anion gap 3 (*)    All other components within normal limits  CBC WITH DIFFERENTIAL/PLATELET - Abnormal; Notable for the following:    RBC 5.31 (*)    Hemoglobin 15.4 (*)    HCT 47.1 (*)    All other components within normal limits  URINALYSIS, ROUTINE W REFLEX MICROSCOPIC - Abnormal; Notable for the following:    APPearance CLOUDY (*)    Hgb urine dipstick LARGE (*)    Leukocytes, UA SMALL (*)    All other components within  normal limits  URINE MICROSCOPIC-ADD ON - Abnormal; Notable for the following:    Squamous Epithelial / LPF FEW (*)    Bacteria, UA MANY (*)    All other components within normal limits  LIPASE, BLOOD    Imaging Review Ct Abdomen Pelvis W Contrast  01/21/2015   CLINICAL DATA:  Right lower quadrant abdominal pain.  EXAM: CT ABDOMEN AND PELVIS WITH CONTRAST  TECHNIQUE: Multidetector CT imaging of the abdomen and pelvis was performed using the standard protocol following bolus administration of intravenous contrast.  CONTRAST:  25mL OMNIPAQUE IOHEXOL 300 MG/ML SOLN, 100mL OMNIPAQUE IOHEXOL 300 MG/ML SOLN  COMPARISON:  08/06/2014  FINDINGS: BODY WALL: Unremarkable.  LOWER CHEST: Tiny sliding hiatal hernia.  ABDOMEN/PELVIS:  Liver: No focal abnormality.  Biliary: Cholecystectomy. No evidence of biliary obstruction or stone.  Pancreas: Unremarkable.  Spleen: Unremarkable.  Adrenals: Unremarkable.  Kidneys and ureters: No hydronephrosis or stone.  Bladder: Unremarkable.  Reproductive: Hysterectomy and oophorectomies.  Bowel: Status post gastric bypass. The gastric pouch is somewhat prominent in size, but there is no evidence of gastrogastric fistula or marginal ulcer. No small bowel obstruction. Normal appendix.  Retroperitoneum: No mass or adenopathy.  Peritoneum: No ascites or pneumoperitoneum.  Vascular: No acute abnormality.  OSSEOUS: No acute abnormalities.  IMPRESSION: 1. No appendicitis or other finding to explain right lower quadrant pain. 2. Gastric bypass.   Electronically Signed   By: Tiburcio PeaJonathan  Watts M.D.   On: 01/21/2015 10:02     EKG Interpretation None      MDM   Final diagnoses:  Right lower quadrant abdominal pain  UTI (lower urinary tract infection)  Elevated LFTs  Vomiting and diarrhea    Will treat for uti. Pt is not vomiting here. Will send home with zofran and keflex. No acute abnormality noted on ct. Pt is okay to follow up     Teressa LowerVrinda Bryce Cheever, NP 01/21/15  1035  Purvis SheffieldForrest Harrison, MD 01/21/15 1446

## 2015-01-21 NOTE — Discharge Instructions (Signed)

## 2015-02-10 ENCOUNTER — Emergency Department (HOSPITAL_BASED_OUTPATIENT_CLINIC_OR_DEPARTMENT_OTHER): Payer: Self-pay

## 2015-02-10 ENCOUNTER — Encounter (HOSPITAL_BASED_OUTPATIENT_CLINIC_OR_DEPARTMENT_OTHER): Payer: Self-pay

## 2015-02-10 ENCOUNTER — Emergency Department (HOSPITAL_BASED_OUTPATIENT_CLINIC_OR_DEPARTMENT_OTHER)
Admission: EM | Admit: 2015-02-10 | Discharge: 2015-02-10 | Disposition: A | Discharge status: Home / Self Care | Payer: Self-pay | Attending: Emergency Medicine | Admitting: Emergency Medicine

## 2015-02-10 DIAGNOSIS — Z9049 Acquired absence of other specified parts of digestive tract: Secondary | ICD-10-CM | POA: Insufficient documentation

## 2015-02-10 DIAGNOSIS — Z8659 Personal history of other mental and behavioral disorders: Secondary | ICD-10-CM | POA: Insufficient documentation

## 2015-02-10 DIAGNOSIS — Z90710 Acquired absence of both cervix and uterus: Secondary | ICD-10-CM | POA: Insufficient documentation

## 2015-02-10 DIAGNOSIS — R1031 Right lower quadrant pain: Secondary | ICD-10-CM | POA: Insufficient documentation

## 2015-02-10 DIAGNOSIS — Z9889 Other specified postprocedural states: Secondary | ICD-10-CM | POA: Insufficient documentation

## 2015-02-10 DIAGNOSIS — Z87442 Personal history of urinary calculi: Secondary | ICD-10-CM | POA: Insufficient documentation

## 2015-02-10 DIAGNOSIS — M419 Scoliosis, unspecified: Secondary | ICD-10-CM | POA: Insufficient documentation

## 2015-02-10 DIAGNOSIS — Z862 Personal history of diseases of the blood and blood-forming organs and certain disorders involving the immune mechanism: Secondary | ICD-10-CM | POA: Insufficient documentation

## 2015-02-10 DIAGNOSIS — R7989 Other specified abnormal findings of blood chemistry: Secondary | ICD-10-CM | POA: Insufficient documentation

## 2015-02-10 DIAGNOSIS — R109 Unspecified abdominal pain: Secondary | ICD-10-CM

## 2015-02-10 DIAGNOSIS — R1011 Right upper quadrant pain: Secondary | ICD-10-CM | POA: Insufficient documentation

## 2015-02-10 DIAGNOSIS — R197 Diarrhea, unspecified: Secondary | ICD-10-CM | POA: Insufficient documentation

## 2015-02-10 DIAGNOSIS — R945 Abnormal results of liver function studies: Secondary | ICD-10-CM

## 2015-02-10 LAB — COMPREHENSIVE METABOLIC PANEL
ALBUMIN: 3.6 g/dL (ref 3.5–5.2)
ALT: 49 U/L — ABNORMAL HIGH (ref 0–35)
ANION GAP: 3 — AB (ref 5–15)
AST: 46 U/L — ABNORMAL HIGH (ref 0–37)
Alkaline Phosphatase: 156 U/L — ABNORMAL HIGH (ref 39–117)
BUN: 10 mg/dL (ref 6–23)
CALCIUM: 8.5 mg/dL (ref 8.4–10.5)
CHLORIDE: 107 mmol/L (ref 96–112)
CO2: 25 mmol/L (ref 19–32)
Creatinine, Ser: 0.55 mg/dL (ref 0.50–1.10)
GFR calc non Af Amer: >90 mL/min (ref >90)
Glucose, Bld: 102 mg/dL — ABNORMAL HIGH (ref 70–99)
Potassium: 3.6 mmol/L (ref 3.5–5.1)
SODIUM: 135 mmol/L (ref 135–145)
Total Bilirubin: 0.4 mg/dL (ref 0.3–1.2)
Total Protein: 7.7 g/dL (ref 6.0–8.3)

## 2015-02-10 LAB — URINALYSIS, ROUTINE W REFLEX MICROSCOPIC
BILIRUBIN URINE: NEGATIVE
GLUCOSE, UA: NEGATIVE mg/dL
Ketones, ur: NEGATIVE mg/dL
NITRITE: NEGATIVE
PH: 5.5 (ref 5.0–8.0)
Protein, ur: NEGATIVE mg/dL
Specific Gravity, Urine: 1.024 (ref 1.005–1.030)
Urobilinogen, UA: 0.2 mg/dL (ref 0.0–1.0)

## 2015-02-10 LAB — URINE MICROSCOPIC-ADD ON

## 2015-02-10 LAB — CBC WITH DIFFERENTIAL/PLATELET
BASOS ABS: 0 10*3/uL (ref 0.0–0.1)
Basophils Relative: 1 % (ref 0–1)
EOS PCT: 2 % (ref 0–5)
Eosinophils Absolute: 0.1 10*3/uL (ref 0.0–0.7)
HEMATOCRIT: 43.6 % (ref 36.0–46.0)
Hemoglobin: 14.2 g/dL (ref 12.0–15.0)
Lymphocytes Relative: 22 % (ref 12–46)
Lymphs Abs: 1.4 10*3/uL (ref 0.7–4.0)
MCH: 28.7 pg (ref 26.0–34.0)
MCHC: 32.6 g/dL (ref 30.0–36.0)
MCV: 88.3 fL (ref 78.0–100.0)
MONO ABS: 0.3 10*3/uL (ref 0.1–1.0)
Monocytes Relative: 5 % (ref 3–12)
NEUTROS PCT: 70 % (ref 43–77)
Neutro Abs: 4.3 10*3/uL (ref 1.7–7.7)
PLATELETS: 292 10*3/uL (ref 150–400)
RBC: 4.94 MIL/uL (ref 3.87–5.11)
RDW: 12.9 % (ref 11.5–15.5)
WBC: 6.1 10*3/uL (ref 4.0–10.5)

## 2015-02-10 LAB — LIPASE, BLOOD: Lipase: 40 U/L (ref 11–59)

## 2015-02-10 MED ORDER — HYDROMORPHONE HCL 1 MG/ML IJ SOLN
2.0000 mg | Freq: Once | INTRAMUSCULAR | Status: AC
  Administered 2015-02-10: 2 mg via INTRAMUSCULAR
  Filled 2015-02-10: qty 2

## 2015-02-10 MED ORDER — OXYCODONE HCL 5 MG PO TABS: 5.0000 mg | ORAL_TABLET | ORAL | Status: DC | PRN

## 2015-02-10 NOTE — Discharge Instructions (Signed)
Abdominal Pain, Women °Abdominal (stomach, pelvic, or belly) pain can be caused by many things. It is important to tell your doctor: °· The location of the pain. °· Does it come and go or is it present all the time? °· Are there things that start the pain (eating certain foods, exercise)? °· Are there other symptoms associated with the pain (fever, nausea, vomiting, diarrhea)? °All of this is helpful to know when trying to find the cause of the pain. °CAUSES  °· Stomach: virus or bacteria infection, or ulcer. °· Intestine: appendicitis (inflamed appendix), regional ileitis (Crohn's disease), ulcerative colitis (inflamed colon), irritable bowel syndrome, diverticulitis (inflamed diverticulum of the colon), or cancer of the stomach or intestine. °· Gallbladder disease or stones in the gallbladder. °· Kidney disease, kidney stones, or infection. °· Pancreas infection or cancer. °· Fibromyalgia (pain disorder). °· Diseases of the female organs: °¨ Uterus: fibroid (non-cancerous) tumors or infection. °¨ Fallopian tubes: infection or tubal pregnancy. °¨ Ovary: cysts or tumors. °¨ Pelvic adhesions (scar tissue). °¨ Endometriosis (uterus lining tissue growing in the pelvis and on the pelvic organs). °¨ Pelvic congestion syndrome (female organs filling up with blood just before the menstrual period). °¨ Pain with the menstrual period. °¨ Pain with ovulation (producing an egg). °¨ Pain with an IUD (intrauterine device, birth control) in the uterus. °¨ Cancer of the female organs. °· Functional pain (pain not caused by a disease, may improve without treatment). °· Psychological pain. °· Depression. °DIAGNOSIS  °Your doctor will decide the seriousness of your pain by doing an examination. °· Blood tests. °· X-rays. °· Ultrasound. °· CT scan (computed tomography, special type of X-ray). °· MRI (magnetic resonance imaging). °· Cultures, for infection. °· Barium enema (dye inserted in the large intestine, to better view it with  X-rays). °· Colonoscopy (looking in intestine with a lighted tube). °· Laparoscopy (minor surgery, looking in abdomen with a lighted tube). °· Major abdominal exploratory surgery (looking in abdomen with a large incision). °TREATMENT  °The treatment will depend on the cause of the pain.  °· Many cases can be observed and treated at home. °· Over-the-counter medicines recommended by your caregiver. °· Prescription medicine. °· Antibiotics, for infection. °· Birth control pills, for painful periods or for ovulation pain. °· Hormone treatment, for endometriosis. °· Nerve blocking injections. °· Physical therapy. °· Antidepressants. °· Counseling with a psychologist or psychiatrist. °· Minor or major surgery. °HOME CARE INSTRUCTIONS  °· Do not take laxatives, unless directed by your caregiver. °· Take over-the-counter pain medicine only if ordered by your caregiver. Do not take aspirin because it can cause an upset stomach or bleeding. °· Try a clear liquid diet (broth or water) as ordered by your caregiver. Slowly move to a bland diet, as tolerated, if the pain is related to the stomach or intestine. °· Have a thermometer and take your temperature several times a day, and record it. °· Bed rest and sleep, if it helps the pain. °· Avoid sexual intercourse, if it causes pain. °· Avoid stressful situations. °· Keep your follow-up appointments and tests, as your caregiver orders. °· If the pain does not go away with medicine or surgery, you may try: °¨ Acupuncture. °¨ Relaxation exercises (yoga, meditation). °¨ Group therapy. °¨ Counseling. °SEEK MEDICAL CARE IF:  °· You notice certain foods cause stomach pain. °· Your home care treatment is not helping your pain. °· You need stronger pain medicine. °· You want your IUD removed. °· You feel faint or   lightheaded.  You develop nausea and vomiting.  You develop a rash.  You are having side effects or an allergy to your medicine. SEEK IMMEDIATE MEDICAL CARE IF:   Your  pain does not go away or gets worse.  You have a fever.  Your pain is felt only in portions of the abdomen. The right side could possibly be appendicitis. The left lower portion of the abdomen could be colitis or diverticulitis.  You are passing blood in your stools (bright red or black tarry stools, with or without vomiting).  You have blood in your urine.  You develop chills, with or without a fever.  You pass out. MAKE SURE YOU:   Understand these instructions.  Will watch your condition.  Will get help right away if you are not doing well or get worse. Document Released: 10/04/2007 Document Revised: 04/23/2014 Document Reviewed: 10/24/2009 [facility] Patient Information 2015 [place], [place]. This information is not intended to replace advice given to you by your health care provider. Make sure you discuss any questions you have with your health care provider.   Liver Profile A liver profile is a battery of tests which helps your caregiver evaluate your liver function. The following tests are often included in the liver profile: Alanine aminotransferase (ALT or SGPT) This is an enzyme found primarily in the liver. Abnormalities may represent liver disease. This is found in cells of the liver so when it is elevated, it has been released by damaged cells. Albumin - The serum albumin is one of the major proteins in the blood and a reflection of the general state of nutrition. This is low when the liver is unable to do its job. It is also low when protein is lost in the urine. NORMAL FINDINGS Adult/Elderly  Total protein: 6.4-8.3 g/dL or 62-1364-83 g/L (SI units)  Albumin: 3.5-5 g/dL or 07-6534-50 g/L (SI units)  Globulin: 2.3-3.4 g/dL  Alpha1 globulin: 7.8-4.60.1-0.3 g/dL or 1-3 g/L (SI units)  Alpha2 globulin: 0.6-1 g/dL or 9-626-10 g/L (SI units)  Beta globulin: 0.7-1.1 g/dL or 9-527-11 g/L (SI units) Children  Total protein  Premature infant: 4.2-7.6 g/dL  Newborn: 8.4-1.34.6-7.4 g/dL  Infant:  2-4.46-6.7 g/dL  Child: 0.1-06.2-8 g/dL  Albumin  Premature infant: 3-4.2 g/dL  Newborn: 2.7-2.53.5-5.4 g/dL  Infant: 3.6-6.44.4-5.4 g/dL  Child: 4-0.34-5.9 g/dL Albumin/Globulin ratio - Calculated by dividing the albumin by the globulin. It is a measure of well being.  Alkaline phosphatase - This is an enzyme which is important in diagnosing proper bone and liver functions. NORMAL FINDINGS Age / Normal Value (units/L)  0-5 days / 35-140  Less than 3 yr / 15-60  3-6 yr / 15-50  6-12 yr / 10-50  12-18 yr / 10-40  Adult / 0-35 units/L or 0-0.58 microKat/L (SI Units) (Females tend to have slightly lower levels than males)  Elderly / Slightly higher than adults Aspartate aminotransferase (AST or SGOT) - an enzyme found in skeletal and heart muscle, liver and other organs. Abnormalities may represent liver disease. This is found in cells of the liver so when it is elevated, it has been released by damaged cells. Bilirubin, Total: A chemical involved with liver functions. High concentrations may result in jaundice. Jaundice is a yellowing of the skin and the whites of the eyes. NORMAL FINDINGS Blood  Adult/elderly/child  Total bilirubin: 0.3-1.0 mg/dL or 4.7-425.1-17 micromole/L (SI units)  Indirect bilirubin: 0.2-0.8 mg/dL or 5.9-56.33.4-12.0 micromole/L (SI units)  Direct bilirubin: 0.1-0.3 mg/dL or 8.7-5.61.7-5.1 micromole/L (SI units)  Newborn total bilirubin: 1.0-12.0 mg/dL or 16.1-096 micromole/L (SI units)  Urine0-0.02 mg/dL Ranges for normal findings may vary among different laboratories and hospitals. You should always check with your doctor after having lab work or other tests done to discuss the meaning of your test results and whether your values are considered within normal limits PREPARATION FOR TEST No preparation or fasting is necessary unless you have been informed otherwise. A blood sample is obtained by inserting a needle into a vein in the arm. MEANING OF TEST  Your caregiver will go over the test  results with you and discuss the importance and meaning of your results, as well as treatment options and the need for additional tests if necessary. OBTAINING THE TEST RESULTS It is your responsibility to obtain your test results. Ask the lab or department performing the test when and how you will get your results. Document Released: 01/09/2005 Document Revised: 02/29/2012 Document Reviewed: 04/10/2014 [facility] Patient Information 2015 [place], [place]. This information is not intended to replace advice given to you by your health care provider. Make sure you discuss any questions you have with your health care provider.

## 2015-02-10 NOTE — ED Notes (Signed)
Patient here with ongoing right sided abdominal pain. Has been seen here for same. describes as sharp, burning pain. No urinary symptoms. Diarrhea occasionally with same.

## 2015-02-10 NOTE — ED Provider Notes (Signed)
CSN: [id number]     Arrival date & time 02/10/15  1352 History   First MD Initiated Contact with Patient 02/10/15 1503     Chief Complaint  Patient presents with  . Abdominal Pain     (Consider location/radiation/quality/duration/timing/severity/associated sxs/prior Treatment) Patient is a 44 y.o. female presenting with abdominal pain. The history is provided by the patient and the spouse. No language interpreter was used.  Abdominal Pain Pain location:  RLQ (R sided) Pain quality: aching and burning   Pain radiates to:  RUQ Pain severity:  Moderate Onset quality:  Unable to specify Duration:  4 weeks Timing:  Constant Progression:  Unchanged Chronicity:  New Context: previous surgery   Context: not diet changes, not eating, not laxative use, not recent illness, not sick contacts and not suspicious food intake   Relieved by:  Nothing Worsened by:  Nothing tried Ineffective treatments:  None tried Associated symptoms: diarrhea   Associated symptoms: no chest pain, no chills, no constipation, no cough, no dysuria, no fatigue, no fever, no flatus, no hematuria, no melena, no nausea, no shortness of breath, no sore throat, no vaginal bleeding, no vaginal discharge and no vomiting     Past Medical History  Diagnosis Date  . Degenerative disk disease   . Scoliosis   . Arthritis   . Depression   . Anemia   . Kidney stone   . Kidney stones    Past Surgical History  Procedure Laterality Date  . Abdominal surgery      c-section  . Cholecystectomy    . Tonsillectomy    . Bariatric surgery    . Abdominal hysterectomy    . C sectiions    . Rectal fiscure     Family History  Problem Relation Age of Onset  . Diabetes Father    History  Substance Use Topics  . Smoking status: Never Smoker   . Smokeless tobacco: Not on file  . Alcohol Use: No   OB History    No data available     Review of Systems  Constitutional: Negative for fever, chills, diaphoresis, activity  change, appetite change and fatigue.  HENT: Negative for congestion, facial swelling, rhinorrhea and sore throat.   Eyes: Negative for photophobia and discharge.  Respiratory: Negative for cough, chest tightness and shortness of breath.   Cardiovascular: Negative for chest pain, palpitations and leg swelling.  Gastrointestinal: Positive for abdominal pain and diarrhea. Negative for nausea, vomiting, constipation, melena and flatus.  Endocrine: Negative for polydipsia and polyuria.  Genitourinary: Negative for dysuria, frequency, hematuria, vaginal bleeding, vaginal discharge, difficulty urinating and pelvic pain.  Musculoskeletal: Negative for back pain, arthralgias, neck pain and neck stiffness.  Skin: Negative for color change and wound.  Allergic/Immunologic: Negative for immunocompromised state.  Neurological: Negative for facial asymmetry, weakness, numbness and headaches.  Hematological: Does not bruise/bleed easily.  Psychiatric/Behavioral: Negative for confusion and agitation.      Allergies  Morphine and related; Gabapentin; Nsaids; and Tramadol  Home Medications   Prior to Admission medications   Medication Sig Start Date End Date Taking? Authorizing Provider  Calcium Carbonate-Vitamin D (CALCIUM + D PO) Take by mouth.    Historical Provider, MD  Cholecalciferol (VITAMIN D3) 3000 UNITS TABS Take by mouth.    Historical Provider, MD   BP 130/78 mmHg  Pulse 74  Temp(Src) 98.1 F (36.7 C) (Oral)  Resp 18  Ht  (1.6 m)  Wt 220 lb (99.791 kg)  BMI 38.98 kg/m2  SpO2 100% Physical Exam  Constitutional: She is oriented to person, place, and time. She appears well-developed and well-nourished. No distress.  HENT:  Head: Normocephalic and atraumatic.  Mouth/Throat: No oropharyngeal exudate.  Eyes: Pupils are equal, round, and reactive to light.  Neck: Normal range of motion. Neck supple.  Cardiovascular: Normal rate, regular rhythm and normal heart sounds.  Exam  reveals no gallop and no friction rub.   No murmur heard. Pulmonary/Chest: Effort normal and breath sounds normal. No respiratory distress. She has no wheezes. She has no rales.  Abdominal: Soft. Bowel sounds are normal. She exhibits no distension and no mass. There is tenderness in the right upper quadrant and right lower quadrant. There is no rebound and no guarding.  Musculoskeletal: Normal range of motion. She exhibits no edema or tenderness.  Neurological: She is alert and oriented to person, place, and time.  Skin: Skin is warm and dry.  Psychiatric: She has a normal mood and affect.    ED Course  Procedures (including critical care time) Labs Review Labs Reviewed  URINALYSIS, ROUTINE W REFLEX MICROSCOPIC - Abnormal; Notable for the following:    Hgb urine dipstick TRACE (*)    Leukocytes, UA TRACE (*)    All other components within normal limits  URINE MICROSCOPIC-ADD ON - Abnormal; Notable for the following:    Bacteria, UA FEW (*)    All other components within normal limits  PREGNANCY, URINE    Imaging Review No results found.   EKG Interpretation None     CLINICAL DATA: Right lower quadrant abdominal pain.  EXAM: CT ABDOMEN AND PELVIS WITH CONTRAST  TECHNIQUE: Multidetector CT imaging of the abdomen and pelvis was performed using the standard protocol following bolus administration of intravenous contrast.  CONTRAST: 25mL OMNIPAQUE IOHEXOL 300 MG/ML SOLN, 100mL OMNIPAQUE IOHEXOL 300 MG/ML SOLN  COMPARISON: 08/06/2014  FINDINGS: BODY WALL: Unremarkable.  LOWER CHEST: Tiny sliding hiatal hernia.  ABDOMEN/PELVIS:  Liver: No focal abnormality.  Biliary: Cholecystectomy. No evidence of biliary obstruction or stone.  Pancreas: Unremarkable.  Spleen: Unremarkable.  Adrenals: Unremarkable.  Kidneys and ureters: No hydronephrosis or stone.  Bladder: Unremarkable.  Reproductive: Hysterectomy and oophorectomies.  Bowel: Status  post gastric bypass. The gastric pouch is somewhat prominent in size, but there is no evidence of gastrogastric fistula or marginal ulcer. No small bowel obstruction. Normal appendix.  Retroperitoneum: No mass or adenopathy.  Peritoneum: No ascites or pneumoperitoneum.  Vascular: No acute abnormality.  OSSEOUS: No acute abnormalities.  IMPRESSION: 1. No appendicitis or other finding to explain right lower quadrant pain. 2. Gastric bypass.   Electronically Signed  By: Tiburcio PeaJonathan Watts M.D.  On: 01/21/2015 10:02  MDM   Final diagnoses:  None    Pt is a 44 y.o. female with Pmhx as above who presents with about 1 month of RLQ/R sided ab pain, described as sharp, burning, no assoc n/v, fever, chills, dysuria, hematuria, vaginal bleeding or d/c. Pt has surgical hx of  Total hysterectomy, cholecystectomy, bariatric surgery.  This is third visit in 1 month for same. She was first diagnosed w/ BV, then UTI, but symptoms persisted. On PE, VSS, pt in NAD. +RUQ and RLQ ttp w/o rebound or guarding.   Given history of kidney stones.  CT stone study ordered and is negative.  Her LFTs are mildly elevated, similar to about 1 month ago.  She denies drug, alcohol use or heavy use of over-the-counter medicines, though does use about 1-2 g of Tylenol a day.  I  have asked her to cut back on this.  I do not feel she has acute surgical cause pain.  I doubt PID, given that she has a history of a total hysterectomy.  I believe she would best benefit from evaluation by gastroenterology.  [facility]  I have encouraged her to keep that appointment to establish with a local PCP.   [facility] Staff evaluation in the Emergency Department is complete. It has been determined that no acute conditions requiring further emergency intervention are present at this time. The patient/guardian have been advised of the diagnosis  and plan. We have discussed signs and symptoms that warrant return to the ED, such as changes or worsening in symptoms, worsening pain, fever, inability to tolerate liquids      Toy Cookey, MD 02/11/15 0131

## 2015-02-11 ENCOUNTER — Encounter (HOSPITAL_BASED_OUTPATIENT_CLINIC_OR_DEPARTMENT_OTHER): Payer: Self-pay

## 2015-02-11 ENCOUNTER — Emergency Department (HOSPITAL_BASED_OUTPATIENT_CLINIC_OR_DEPARTMENT_OTHER): Payer: Self-pay

## 2015-02-11 ENCOUNTER — Emergency Department (HOSPITAL_BASED_OUTPATIENT_CLINIC_OR_DEPARTMENT_OTHER)
Admission: EM | Admit: 2015-02-11 | Discharge: 2015-02-11 | Disposition: A | Discharge status: Home / Self Care | Payer: Self-pay | Attending: Emergency Medicine | Admitting: Emergency Medicine

## 2015-02-11 DIAGNOSIS — Z87442 Personal history of urinary calculi: Secondary | ICD-10-CM | POA: Insufficient documentation

## 2015-02-11 DIAGNOSIS — R1110 Vomiting, unspecified: Secondary | ICD-10-CM | POA: Insufficient documentation

## 2015-02-11 DIAGNOSIS — Z79899 Other long term (current) drug therapy: Secondary | ICD-10-CM | POA: Insufficient documentation

## 2015-02-11 DIAGNOSIS — R197 Diarrhea, unspecified: Secondary | ICD-10-CM | POA: Insufficient documentation

## 2015-02-11 DIAGNOSIS — Z8659 Personal history of other mental and behavioral disorders: Secondary | ICD-10-CM | POA: Insufficient documentation

## 2015-02-11 DIAGNOSIS — Z8739 Personal history of other diseases of the musculoskeletal system and connective tissue: Secondary | ICD-10-CM | POA: Insufficient documentation

## 2015-02-11 DIAGNOSIS — R109 Unspecified abdominal pain: Secondary | ICD-10-CM | POA: Insufficient documentation

## 2015-02-11 DIAGNOSIS — Z862 Personal history of diseases of the blood and blood-forming organs and certain disorders involving the immune mechanism: Secondary | ICD-10-CM | POA: Insufficient documentation

## 2015-02-11 LAB — COMPREHENSIVE METABOLIC PANEL
ALBUMIN: 3.6 g/dL (ref 3.5–5.2)
ALK PHOS: 153 U/L — AB (ref 39–117)
ALT: 38 U/L — ABNORMAL HIGH (ref 0–35)
AST: 34 U/L (ref 0–37)
Anion gap: 5 (ref 5–15)
BUN: 9 mg/dL (ref 6–23)
CO2: 22 mmol/L (ref 19–32)
CREATININE: 0.5 mg/dL (ref 0.50–1.10)
Calcium: 8.3 mg/dL — ABNORMAL LOW (ref 8.4–10.5)
Chloride: 105 mmol/L (ref 96–112)
GFR calc non Af Amer: >90 mL/min (ref >90)
Glucose, Bld: 98 mg/dL (ref 70–99)
POTASSIUM: 3.3 mmol/L — AB (ref 3.5–5.1)
SODIUM: 132 mmol/L — AB (ref 135–145)
Total Bilirubin: 0.8 mg/dL (ref 0.3–1.2)
Total Protein: 7.6 g/dL (ref 6.0–8.3)

## 2015-02-11 LAB — CBC WITH DIFFERENTIAL/PLATELET
BASOS ABS: 0 10*3/uL (ref 0.0–0.1)
Basophils Relative: 0 % (ref 0–1)
EOS ABS: 0 10*3/uL (ref 0.0–0.7)
Eosinophils Relative: 0 % (ref 0–5)
HCT: 43 % (ref 36.0–46.0)
Hemoglobin: 14.2 g/dL (ref 12.0–15.0)
Lymphocytes Relative: 17 % (ref 12–46)
Lymphs Abs: 0.7 10*3/uL (ref 0.7–4.0)
MCH: 28.9 pg (ref 26.0–34.0)
MCHC: 33 g/dL (ref 30.0–36.0)
MCV: 87.6 fL (ref 78.0–100.0)
Monocytes Absolute: 0.2 10*3/uL (ref 0.1–1.0)
Monocytes Relative: 5 % (ref 3–12)
Neutro Abs: 3.2 10*3/uL (ref 1.7–7.7)
Neutrophils Relative %: 78 % — ABNORMAL HIGH (ref 43–77)
Platelets: 246 10*3/uL (ref 150–400)
RBC: 4.91 MIL/uL (ref 3.87–5.11)
RDW: 12.8 % (ref 11.5–15.5)
WBC: 4.1 10*3/uL (ref 4.0–10.5)

## 2015-02-11 LAB — URINE MICROSCOPIC-ADD ON

## 2015-02-11 LAB — URINALYSIS, ROUTINE W REFLEX MICROSCOPIC
GLUCOSE, UA: NEGATIVE mg/dL
Hgb urine dipstick: NEGATIVE
KETONES UR: 15 mg/dL — AB
NITRITE: NEGATIVE
Protein, ur: NEGATIVE mg/dL
SPECIFIC GRAVITY, URINE: 1.025 (ref 1.005–1.030)
Urobilinogen, UA: 0.2 mg/dL (ref 0.0–1.0)
pH: 5.5 (ref 5.0–8.0)

## 2015-02-11 LAB — LIPASE, BLOOD: LIPASE: 29 U/L (ref 11–59)

## 2015-02-11 MED ORDER — ONDANSETRON HCL 4 MG/2ML IJ SOLN
4.0000 mg | Freq: Once | INTRAMUSCULAR | Status: AC
  Administered 2015-02-11: 4 mg via INTRAVENOUS
  Filled 2015-02-11: qty 2

## 2015-02-11 MED ORDER — HYDROMORPHONE HCL 1 MG/ML IJ SOLN
1.0000 mg | Freq: Once | INTRAMUSCULAR | Status: AC
  Administered 2015-02-11: 1 mg via INTRAVENOUS
  Filled 2015-02-11: qty 1

## 2015-02-11 MED ORDER — HYDROCODONE-ACETAMINOPHEN 5-325 MG PO TABS: 1.0000 | ORAL_TABLET | Freq: Four times a day (QID) | ORAL | Status: DC | PRN

## 2015-02-11 MED ORDER — PROMETHAZINE HCL 25 MG PO TABS: 25.0000 mg | ORAL_TABLET | Freq: Four times a day (QID) | ORAL | Status: DC | PRN

## 2015-02-11 MED ORDER — SODIUM CHLORIDE 0.9 % IV BOLUS (SEPSIS)
1000.0000 mL | Freq: Once | INTRAVENOUS | Status: AC
  Administered 2015-02-11: 1000 mL via INTRAVENOUS

## 2015-02-11 NOTE — ED Notes (Signed)
Patient transported to X-ray 

## 2015-02-11 NOTE — ED Notes (Signed)
Pt reports fevers up to 103.4 last night, last tylenol was about 3 hours ago per pt.

## 2015-02-11 NOTE — ED Notes (Signed)
Was seen yesterday for abd pain-c/o new s/s fever n/v/d

## 2015-02-11 NOTE — Discharge Instructions (Signed)

## 2015-02-11 NOTE — ED Notes (Signed)
Pt sts "I have to go now." Pt refused to sign.

## 2015-02-11 NOTE — ED Provider Notes (Signed)
CSN: [id number]     Arrival date & time 02/11/15  1149 History   First MD Initiated Contact with Patient 02/11/15 1149     Chief Complaint  Patient presents with  . Fever     Patient is a 44 y.o. female presenting with fever. The history is provided by the patient. No language interpreter was used.  Fever  Ms. Holzmann presents for evaluation of abdominal pain.  She reports one month of abdominal pain.  The pain is located in the RLQ.  It is described as burning and stabbing in nature.  It is waxing and waning.  No changes with movement, eating, urinating.  She was evaluated in the ED yesterday for the pain.  After her visit she developed severe vomiting (four episodes), diarrhea (four BMs), fever to 103.4 last night.  She denies cough, sore throat, sob, dysuria.  She does have a headache.  Her son had diarrhea two days ago.  Symptoms are moderate in nature.  Past Medical History  Diagnosis Date  . Degenerative disk disease   . Scoliosis   . Arthritis   . Depression   . Anemia   . Kidney stone   . Kidney stones    Past Surgical History  Procedure Laterality Date  . Abdominal surgery      c-section  . Cholecystectomy    . Tonsillectomy    . Bariatric surgery    . Abdominal hysterectomy    . C sectiions    . Rectal fiscure     Family History  Problem Relation Age of Onset  . Diabetes Father    History  Substance Use Topics  . Smoking status: Never Smoker   . Smokeless tobacco: Not on file  . Alcohol Use: No   OB History    No data available     Review of Systems  Constitutional: Positive for fever.  All other systems reviewed and are negative.     Allergies  Morphine and related; Gabapentin; Nsaids; and Tramadol  Home Medications   Prior to Admission medications   Medication Sig Start Date End Date Taking? Authorizing Provider  Calcium Carbonate-Vitamin D (CALCIUM + D PO) Take by mouth.    Historical Provider, MD  Cholecalciferol (VITAMIN D3) 3000 UNITS TABS  Take by mouth.    Historical Provider, MD  oxyCODONE (ROXICODONE) 5 MG immediate release tablet Take 1 tablet (5 mg total) by mouth every 4 (four) hours as needed for severe pain. 02/10/15   Toy Cookey, MD   BP 116/68 mmHg  Pulse 89  Temp(Src) 98.8 F (37.1 C) (Oral)  Resp 20  Ht  (1.6 m)  Wt 220 lb (99.791 kg)  BMI 38.98 kg/m2  SpO2 97% Physical Exam  Constitutional: She is oriented to person, place, and time. She appears well-developed and well-nourished.  HENT:  Head: Normocephalic and atraumatic.  Cardiovascular: Normal rate and regular rhythm.   No murmur heard. Pulmonary/Chest: Effort normal and breath sounds normal. No respiratory distress.  Abdominal: Soft.  Mild right sided abdominal tenderness without guarding or rebound tenderness  Musculoskeletal: She exhibits no edema or tenderness.  Neurological: She is alert and oriented to person, place, and time.  Skin: Skin is warm and dry.  Psychiatric: She has a normal mood and affect. Her behavior is normal.  Nursing note and vitals reviewed.   ED Course  Procedures (including critical care time) Labs Review Labs Reviewed  COMPREHENSIVE METABOLIC PANEL - Abnormal; Notable for the following:  Sodium 132 (*)    Potassium 3.3 (*)    Calcium 8.3 (*)    ALT 38 (*)    Alkaline Phosphatase 153 (*)    All other components within normal limits  CBC WITH DIFFERENTIAL/PLATELET - Abnormal; Notable for the following:    Neutrophils Relative % 78 (*)    All other components within normal limits  URINALYSIS, ROUTINE W REFLEX MICROSCOPIC - Abnormal; Notable for the following:    Color, Urine AMBER (*)    APPearance CLOUDY (*)    Bilirubin Urine SMALL (*)    Ketones, ur 15 (*)    Leukocytes, UA SMALL (*)    All other components within normal limits  URINE MICROSCOPIC-ADD ON - Abnormal; Notable for the following:    Bacteria, UA FEW (*)    All other components within normal limits  URINE CULTURE  LIPASE, BLOOD     Imaging Review Dg Abd Acute W/chest  02/11/2015   CLINICAL DATA:  One month history of abdominal pain. One day history of diarrhea  EXAM: ACUTE ABDOMEN SERIES (ABDOMEN 2 VIEW & CHEST 1 VIEW)  COMPARISON:  Chest radiograph October 29, 2014; CT abdomen and pelvis February 10, 2015  FINDINGS: PA chest: There is slight scarring in the left base. There is no edema or consolidation. Heart size and pulmonary vascularity are normal. No adenopathy.  Supine and upright abdomen: There is no bowel dilatation. There are scattered air-fluid levels throughout the abdomen and pelvis, however. No free air. There are multiple surgical clips in the upper abdomen.  IMPRESSION: Multiple air-fluid levels without bowel dilatation. Suspect enteritis or early ileus. Early bowel obstruction could present in this manner, although ileus or enteritis are felt to be more likely given this appearance.  No lung edema or consolidation.  No free air.   Electronically Signed   By: Bretta BangWilliam  Woodruff III M.D.   On: 02/11/2015 13:32   Ct Renal Stone Study  02/10/2015   CLINICAL DATA:  Right-side abdominal pain for 1 month, sharp and burning, evaluated by CT on 01/21/2015, history kidney stones, cholecystectomy, hysterectomy, arthritis  EXAM: CT ABDOMEN AND PELVIS WITHOUT CONTRAST  TECHNIQUE: Multidetector CT imaging of the abdomen and pelvis was performed following the standard protocol without IV contrast. Sagittal and coronal MPR images reconstructed from axial data set. Oral contrast not administered for this indication.  COMPARISON:  01/21/2015  FINDINGS: Lung bases clear.  Liver, spleen, pancreas, kidneys, and adrenal glands normal for technique.  Normal appendix.  Prior cholecystectomy, gastric bypass surgery and hysterectomy with nonvisualization of ovaries.  Stomach and bowel loops normal appearance for technique.  Normal appearing bladder and ureters.  No mass, adenopathy, free fluid, free air, or inflammatory process.  Bones  unremarkable.  IMPRESSION: No acute intra-abdominal or intrapelvic abnormalities.  Specifically no cause for right-side abdominal pain identified.   Electronically Signed   By: Ulyses SouthwardMark  Boles M.D.   On: 02/10/2015 16:04     EKG Interpretation None      MDM   Final diagnoses:  Combined abdominal pain, vomiting, and diarrhea    Patient with history of gastric bypass and cholecystectomy here for evaluation of right-sided abdominal pain with associated vomiting and diarrhea and reported fevers at home. In terms of abdominal pain - this has been ongoing for the last month, clinical picture is not consistent with acute appendicitis, bowel obstruction. There are no hernias palpated on exam. Discussed with patient importance of following up with bariatric surgery in the office, contact information  for given for follow-up. Terms of vomiting diarrhea and reported fevers, patient was sick contact at home similar symptoms, patient is mildly dehydrated by laboratory analysis. Patient is tolerating fluids and feeling improved on recheck in the emergency department. Providing prescription for Norco and Phenergan with plans close outpatient follow-up with PCP as well as surgery.    Tilden Fossa, MD 02/11/15 [phone]

## 2015-02-13 LAB — URINE CULTURE

## 2015-02-22 ENCOUNTER — Ambulatory Visit: Payer: Self-pay | Attending: Internal Medicine | Admitting: Family Medicine

## 2015-02-22 VITALS — BP 119/77 | HR 75 | Temp 98.5°F | Resp 16 | Ht 63.0 in | Wt 240.0 lb

## 2015-02-22 DIAGNOSIS — R109 Unspecified abdominal pain: Secondary | ICD-10-CM | POA: Insufficient documentation

## 2015-02-22 DIAGNOSIS — Z9049 Acquired absence of other specified parts of digestive tract: Secondary | ICD-10-CM | POA: Insufficient documentation

## 2015-02-22 DIAGNOSIS — R1031 Right lower quadrant pain: Secondary | ICD-10-CM | POA: Insufficient documentation

## 2015-02-22 DIAGNOSIS — Z9884 Bariatric surgery status: Secondary | ICD-10-CM | POA: Insufficient documentation

## 2015-02-22 NOTE — Progress Notes (Signed)
Patient here after hospitalization  Patient with history of gastric bypass and cholecystectomy here for evaluation of right-sided abdominal pain with associated vomiting and diarrhea and reported fevers at home. In terms of abdominal pain - this has been ongoing for the last month, clinical picture is not consistent with acute appendicitis, bowel obstruction. There are no hernias palpated on exam. Discussed with patient importance of following up with bariatric surgery in the office, contact information for given for follow-up. Terms of vomiting diarrhea and reported fevers, patient was sick contact at home similar symptoms, patient is mildly dehydrated by laboratory analysis. Patient is tolerating fluids and feeling improved on recheck in the emergency department. Providing prescription for Norco and Phenergan with plans close outpatient follow-up with PCP as well as surgery.  Patient presents 02/22/15 [facility] with lower right quadrant abdominal pain 7/10 in nature that is worth with bowel movements.  She describs it as a burning/stabbing pain.  Of note [facility] and 02/11/15 and was given two different RX for pain medication,  She states that she is out of both of those. Patient vital sing WNL and has a history of bariatric surgery.

## 2015-02-22 NOTE — Patient Instructions (Signed)
Make an appointment for financial counselling. Make an appointment for next week with Primary doctor here. If develops fever or intractable vomiting before vists next week, follow-up in ED.

## 2015-02-24 ENCOUNTER — Emergency Department (HOSPITAL_BASED_OUTPATIENT_CLINIC_OR_DEPARTMENT_OTHER)
Admission: EM | Admit: 2015-02-24 | Discharge: 2015-02-24 | Disposition: A | Discharge status: Home / Self Care | Payer: Self-pay | Attending: Emergency Medicine | Admitting: Emergency Medicine

## 2015-02-24 ENCOUNTER — Encounter (HOSPITAL_BASED_OUTPATIENT_CLINIC_OR_DEPARTMENT_OTHER): Payer: Self-pay | Admitting: *Deleted

## 2015-02-24 DIAGNOSIS — Z862 Personal history of diseases of the blood and blood-forming organs and certain disorders involving the immune mechanism: Secondary | ICD-10-CM | POA: Insufficient documentation

## 2015-02-24 DIAGNOSIS — Z87442 Personal history of urinary calculi: Secondary | ICD-10-CM | POA: Insufficient documentation

## 2015-02-24 DIAGNOSIS — R197 Diarrhea, unspecified: Secondary | ICD-10-CM | POA: Insufficient documentation

## 2015-02-24 DIAGNOSIS — R112 Nausea with vomiting, unspecified: Secondary | ICD-10-CM | POA: Insufficient documentation

## 2015-02-24 DIAGNOSIS — M1990 Unspecified osteoarthritis, unspecified site: Secondary | ICD-10-CM | POA: Insufficient documentation

## 2015-02-24 DIAGNOSIS — N390 Urinary tract infection, site not specified: Secondary | ICD-10-CM | POA: Insufficient documentation

## 2015-02-24 DIAGNOSIS — Z8659 Personal history of other mental and behavioral disorders: Secondary | ICD-10-CM | POA: Insufficient documentation

## 2015-02-24 LAB — URINALYSIS, ROUTINE W REFLEX MICROSCOPIC
Bilirubin Urine: NEGATIVE
Glucose, UA: NEGATIVE mg/dL
KETONES UR: NEGATIVE mg/dL
Nitrite: NEGATIVE
PROTEIN: NEGATIVE mg/dL
Specific Gravity, Urine: 1.023 (ref 1.005–1.030)
Urobilinogen, UA: 0.2 mg/dL (ref 0.0–1.0)
pH: 6 (ref 5.0–8.0)

## 2015-02-24 LAB — COMPREHENSIVE METABOLIC PANEL
ALT: 33 U/L (ref 0–35)
AST: 37 U/L (ref 0–37)
Albumin: 3.6 g/dL (ref 3.5–5.2)
Alkaline Phosphatase: 155 U/L — ABNORMAL HIGH (ref 39–117)
Anion gap: 3 — ABNORMAL LOW (ref 5–15)
BILIRUBIN TOTAL: 0.4 mg/dL (ref 0.3–1.2)
BUN: 7 mg/dL (ref 6–23)
CO2: 27 mmol/L (ref 19–32)
Calcium: 8.2 mg/dL — ABNORMAL LOW (ref 8.4–10.5)
Chloride: 108 mmol/L (ref 96–112)
Creatinine, Ser: 0.51 mg/dL (ref 0.50–1.10)
GFR calc Af Amer: >90 mL/min (ref >90)
GFR calc non Af Amer: >90 mL/min (ref >90)
Glucose, Bld: 103 mg/dL — ABNORMAL HIGH (ref 70–99)
Potassium: 3.1 mmol/L — ABNORMAL LOW (ref 3.5–5.1)
Sodium: 138 mmol/L (ref 135–145)
Total Protein: 7.6 g/dL (ref 6.0–8.3)

## 2015-02-24 LAB — URINE MICROSCOPIC-ADD ON

## 2015-02-24 LAB — CBC WITH DIFFERENTIAL/PLATELET
Basophils Absolute: 0 10*3/uL (ref 0.0–0.1)
Basophils Relative: 0 % (ref 0–1)
EOS ABS: 0.1 10*3/uL (ref 0.0–0.7)
Eosinophils Relative: 2 % (ref 0–5)
HEMATOCRIT: 45.7 % (ref 36.0–46.0)
Hemoglobin: 15 g/dL (ref 12.0–15.0)
Lymphocytes Relative: 30 % (ref 12–46)
Lymphs Abs: 2.2 10*3/uL (ref 0.7–4.0)
MCH: 28.4 pg (ref 26.0–34.0)
MCHC: 32.8 g/dL (ref 30.0–36.0)
MCV: 86.6 fL (ref 78.0–100.0)
MONO ABS: 0.5 10*3/uL (ref 0.1–1.0)
MONOS PCT: 8 % (ref 3–12)
NEUTROS ABS: 4.4 10*3/uL (ref 1.7–7.7)
Neutrophils Relative %: 60 % (ref 43–77)
PLATELETS: 349 10*3/uL (ref 150–400)
RBC: 5.28 MIL/uL — AB (ref 3.87–5.11)
RDW: 12.9 % (ref 11.5–15.5)
WBC: 7.2 10*3/uL (ref 4.0–10.5)

## 2015-02-24 LAB — LIPASE, BLOOD: Lipase: 40 U/L (ref 11–59)

## 2015-02-24 MED ORDER — SODIUM CHLORIDE 0.9 % IV BOLUS (SEPSIS)
1000.0000 mL | INTRAVENOUS | Status: AC
  Administered 2015-02-24: 1000 mL via INTRAVENOUS

## 2015-02-24 MED ORDER — HYDROMORPHONE HCL 1 MG/ML IJ SOLN
1.0000 mg | Freq: Once | INTRAMUSCULAR | Status: AC
  Administered 2015-02-24: 1 mg via INTRAVENOUS
  Filled 2015-02-24: qty 1

## 2015-02-24 MED ORDER — PHENAZOPYRIDINE HCL 200 MG PO TABS: 200.0000 mg | ORAL_TABLET | Freq: Three times a day (TID) | ORAL | Status: DC

## 2015-02-24 MED ORDER — ONDANSETRON HCL 4 MG/2ML IJ SOLN
4.0000 mg | INTRAMUSCULAR | Status: AC
  Administered 2015-02-24: 4 mg via INTRAVENOUS
  Filled 2015-02-24: qty 2

## 2015-02-24 MED ORDER — CEPHALEXIN 500 MG PO CAPS: 500.0000 mg | ORAL_CAPSULE | Freq: Three times a day (TID) | ORAL | Status: DC

## 2015-02-24 NOTE — Discharge Instructions (Signed)
Is follow the directions provided. Be sure to follow-up with your primary care doctor to ensure you're getting better. Please continue drink plenty of fluids by mouth to stay well hydrated. Please take your antibiotic as directed to treat urinary tract infection. Don't hesitate to return for any new, worsening, or concerning symptoms.   SEEK IMMEDIATE MEDICAL CARE IF:  You have severe back pain or lower abdominal pain.  You develop chills.  You have nausea or vomiting.  You have continued burning or discomfort with urination.

## 2015-02-24 NOTE — ED Notes (Signed)
Pt having abd pain, seen two weeks ago for same. Went to a physician on Friday and was told she needed to make another appt. Pt states the pain is currently not any better.

## 2015-02-24 NOTE — ED Notes (Signed)
MD at bedside. 

## 2015-02-24 NOTE — ED Provider Notes (Signed)
CSN: [id number]     Arrival date & time 02/24/15  1334 History  This chart was scribed for non-physician practitioner working with Rolan BuccoMelanie Belfi, MD by Richarda Overlieichard Holland, ED Scribe. This patient was seen in room MH07/MH07 and the patient's care was started at 4:03 PM.   Chief Complaint  Patient presents with  . Abdominal Pain   The history is provided by the patient. No language interpreter was used.   HPI Comments: Catherine Gallegos is a 44 y.o. female with a history of anemia and kidney stones who presents to the Emergency Department complaining of intermittent lower abdominal pain for the last 6 weeks. She describes the pain as stabbing and burning. Pt states that she was seen two weeks ago after her abdominal pain worsened and became constant. She says that she received a CT scan and was told it was normal at that time. Pt reports she vomited once 4 days ago and has been having frequent episodes of diarrhea with her last episode being this morning. She describes the stools as looser than normal with a tan color. Pt reports chills and decreased appetite as well. She says that she went to her OBGYN physician 3 days ago and was told she needed to make another appointment. Pt reports a past surgical history of c-section and hysterectomy. She denies any urinary symptoms at this time.   Past Medical History  Diagnosis Date  . Degenerative disk disease   . Scoliosis   . Arthritis   . Depression   . Anemia   . Kidney stone   . Kidney stones   . Osteoporosis    Past Surgical History  Procedure Laterality Date  . Abdominal surgery      c-section  . Cholecystectomy    . Tonsillectomy    . Bariatric surgery    . Abdominal hysterectomy    . C sectiions    . Rectal fiscure     Family History  Problem Relation Age of Onset  . Diabetes Father    History  Substance Use Topics  . Smoking status: Never Smoker   . Smokeless tobacco: Not on file  . Alcohol Use: No   OB History    No data available      Review of Systems  Constitutional: Positive for chills and appetite change. Negative for fever.  HENT: Negative for sore throat.   Eyes: Negative for visual disturbance.  Respiratory: Negative for cough and shortness of breath.   Cardiovascular: Negative for chest pain and leg swelling.  Gastrointestinal: Positive for nausea, vomiting, abdominal pain and diarrhea.  Genitourinary: Negative for dysuria, vaginal discharge and vaginal pain.  Musculoskeletal: Negative for myalgias.  Skin: Negative for rash.  Neurological: Negative for weakness, numbness and headaches.  All other systems reviewed and are negative.   Allergies  Morphine and related; Gabapentin; Nsaids; and Tramadol  Home Medications   Prior to Admission medications   Medication Sig Start Date End Date Taking? Authorizing Provider  Calcium Carbonate-Vitamin D (CALCIUM + D PO) Take by mouth.    Historical Provider, MD  Cholecalciferol (VITAMIN D3) 3000 UNITS TABS Take by mouth.    Historical Provider, MD  HYDROcodone-acetaminophen (NORCO/VICODIN) 5-325 MG per tablet Take 1 tablet by mouth every 6 (six) hours as needed for moderate pain or severe pain. Patient not taking: Reported on 02/22/2015 02/11/15   Tilden FossaElizabeth Rees, MD  oxyCODONE (ROXICODONE) 5 MG immediate release tablet Take 1 tablet (5 mg total) by mouth every 4 (four) hours as  needed for severe pain. Patient not taking: Reported on 02/22/2015 02/10/15   Toy Cookey, MD  promethazine (PHENERGAN) 25 MG tablet Take 1 tablet (25 mg total) by mouth every 6 (six) hours as needed for nausea or vomiting. Patient not taking: Reported on 02/22/2015 02/11/15   Tilden Fossa, MD   BP 130/90 mmHg  Pulse 58  Temp(Src) 98.3 F (36.8 C) (Oral)  Resp 18  SpO2 99% Physical Exam  Constitutional: She is oriented to person, place, and time. She appears well-developed and well-nourished. No distress.  HENT:  Head: Normocephalic and atraumatic.  Mouth/Throat: Oropharynx is clear  and moist. No oropharyngeal exudate.  Eyes: Conjunctivae are normal. Right eye exhibits no discharge. Left eye exhibits no discharge.  Neck: Neck supple. No tracheal deviation present. No thyromegaly present.  Cardiovascular: Normal rate, regular rhythm, normal heart sounds and intact distal pulses.   Pulmonary/Chest: Effort normal and breath sounds normal. No respiratory distress. She has no wheezes. She has no rales. She exhibits no tenderness.  Abdominal: Soft. She exhibits no distension and no mass. There is no hepatosplenomegaly. There is tenderness. There is no rigidity, no rebound, no guarding, no CVA tenderness, no tenderness at McBurney's point and negative Murphy's sign.    TTP right suprapubic abdomen. No tenderness to RUQ, LUQ or RLQ. Mild tenderness to periumbilical abdomen.   Musculoskeletal: She exhibits no tenderness.  Lymphadenopathy:    She has no cervical adenopathy.  Neurological: She is alert and oriented to person, place, and time.  Skin: Skin is warm and dry. No rash noted. She is not diaphoretic.  Psychiatric: She has a normal mood and affect. Her behavior is normal.  Nursing note and vitals reviewed.   ED Course  Procedures   DIAGNOSTIC STUDIES: Oxygen Saturation is 99% on RA, normal by my interpretation.    COORDINATION OF CARE: 4:11 PM Discussed treatment plan with pt at bedside and pt agreed to plan.   Labs Review Labs Reviewed  CBC WITH DIFFERENTIAL/PLATELET - Abnormal; Notable for the following:    RBC 5.28 (*)    All other components within normal limits  COMPREHENSIVE METABOLIC PANEL - Abnormal; Notable for the following:    Potassium 3.1 (*)    Glucose, Bld 103 (*)    Calcium 8.2 (*)    Alkaline Phosphatase 155 (*)    Anion gap 3 (*)    All other components within normal limits  URINALYSIS, ROUTINE W REFLEX MICROSCOPIC - Abnormal; Notable for the following:    APPearance CLOUDY (*)    Hgb urine dipstick TRACE (*)    Leukocytes, UA MODERATE  (*)    All other components within normal limits  URINE MICROSCOPIC-ADD ON - Abnormal; Notable for the following:    Squamous Epithelial / LPF FEW (*)    Bacteria, UA MANY (*)    All other components within normal limits  LIPASE, BLOOD    Imaging Review No results found.   EKG Interpretation None      MDM   Final diagnoses:  UTI (lower urinary tract infection)   44 yo with ongoing abd pain, and vomiting and diarrhea.  She was seen a month ago for the same with CT abd/pelvis with no significant findings.  She was evaluated for similar symptoms 2 weeks ago with non contrast abd CTwith no significant findings. Pt is scheduled for a visit to [facility] next week. NS bolus given and her pain and nausea managed in the ED.  Despite report of generalized pain she only has suprapubic tenderness to palpation. Based on exam and moderate leukocytes in urine, she has been diagnosed with a UTI. She is afebrile and all other labs reviewed without significant abnormality. Discussed case with Dr. Fredderick Phenix. Pt is well-appearing, in no acute distress and vital signs reviewed and not concerning. She appears safe to be discharged.  Discharge include follow-up with their PCP.  Return precautions provided.   I personally performed the services described in this documentation, which was scribed in my presence. The recorded information has been reviewed and is accurate.  Filed Vitals:   02/24/15 1401 02/24/15 1601 02/24/15 1815  BP: 131/89 130/90 129/68  Pulse: 73 58 77  Temp: 98.3 F (36.8 C)    TempSrc: Oral    Resp: SpO2: 100% 99% 100%   Meds given in ED:  Medications  sodium chloride 0.9 % bolus 1,000 mL (0 mLs Intravenous Stopped 02/24/15 1822)  ondansetron (ZOFRAN) injection 4 mg (4 mg Intravenous Given 02/24/15 1645)  HYDROmorphone (DILAUDID) injection 1 mg (1 mg Intravenous Given 02/24/15 1645)    Discharge Medication List as of 02/24/2015  6:35 PM     START taking these medications   Details  cephALEXin (KEFLEX) 500 MG capsule Take 1 capsule (500 mg total) by mouth 3 (three) times daily., Starting 02/24/2015, Until Discontinued, Print           Harle Battiest, NP 02/25/15 1610  Rolan Bucco, MD 02/27/15 1247

## 2015-02-28 ENCOUNTER — Telehealth: Payer: Self-pay | Admitting: Internal Medicine

## 2015-02-28 ENCOUNTER — Encounter: Payer: Self-pay | Admitting: Internal Medicine

## 2015-02-28 ENCOUNTER — Ambulatory Visit: Payer: Self-pay | Attending: Internal Medicine | Admitting: Internal Medicine

## 2015-02-28 VITALS — BP 125/80 | HR 68 | Temp 98.7°F | Resp 18 | Ht 63.0 in | Wt 238.0 lb

## 2015-02-28 DIAGNOSIS — R1030 Lower abdominal pain, unspecified: Secondary | ICD-10-CM

## 2015-02-28 NOTE — Telephone Encounter (Signed)
Pt calling to speak to CMA regarding PCP's decision of medications discussed during today's office visit.  Please f/u with pt.

## 2015-02-28 NOTE — Progress Notes (Signed)
Patient ID: Azia Toutant, female   DOB: 1971/06/06, 44 y.o.   MRN: [id number]  CC: abdominal pain  HPI: Thursa Emme is a 44 y.o. female with a history of anemia and kidney stones who presents to the clinic today with complaints of intermittent lower abdominal pain--right side for the last 2 months. She describes the pain as stabbing and burning. Pt has been seen several times in the ER for the same pain after her abdominal pain worsened and became constant. She had several CT scans that were all normal. Pt reports the vomiting and diarrhea have all resolved within the last week.  Pt reports decreased appetite as well. She reports that the pain becomes more intense with bowel movements and has loose stools due to her bariatric surgery. Pt reports a past surgical history of c-section, bariatric surgery (2007), and hysterectomy. She denies any urinary symptoms at this time.    Allergies  Allergen Reactions  . Morphine And Related Anaphylaxis  . Gabapentin   . Nsaids     Can't take PO NSAIDS due to Bariatric surgery.  . Tramadol    Past Medical History  Diagnosis Date  . Degenerative disk disease   . Scoliosis   . Arthritis   . Depression   . Anemia   . Kidney stone   . Kidney stones   . Osteoporosis    Current Outpatient Prescriptions on File Prior to Visit  Medication Sig Dispense Refill  . Calcium Carbonate-Vitamin D (CALCIUM + D PO) Take by mouth.    . cephALEXin (KEFLEX) 500 MG capsule Take 1 capsule (500 mg total) by mouth 3 (three) times daily. 21 capsule 0  . Cholecalciferol (VITAMIN D3) 3000 UNITS TABS Take by mouth.    Marland Kitchen HYDROcodone-acetaminophen (NORCO/VICODIN) 5-325 MG per tablet Take 1 tablet by mouth every 6 (six) hours as needed for moderate pain or severe pain. (Patient not taking: Reported on 02/22/2015) 10 tablet 0  . oxyCODONE (ROXICODONE) 5 MG immediate release tablet Take 1 tablet (5 mg total) by mouth every 4 (four) hours as needed for severe pain. (Patient not taking:  Reported on 02/22/2015) 20 tablet 0  . phenazopyridine (PYRIDIUM) 200 MG tablet Take 1 tablet (200 mg total) by mouth 3 (three) times daily. (Patient not taking: Reported on 02/28/2015) 6 tablet 0  . promethazine (PHENERGAN) 25 MG tablet Take 1 tablet (25 mg total) by mouth every 6 (six) hours as needed for nausea or vomiting. (Patient not taking: Reported on 02/22/2015) 10 tablet 0   No current facility-administered medications on file prior to visit.   Family History  Problem Relation Age of Onset  . Diabetes Father   . Cancer Father   . Osteoporosis Mother    History   Social History  . Marital Status: Married    Spouse Name: N/A  . Number of Children: N/A  . Years of Education: N/A   Occupational History  . Not on file.   Social History Main Topics  . Smoking status: Never Smoker   . Smokeless tobacco: Not on file  . Alcohol Use: No  . Drug Use: No  . Sexual Activity: Not on file   Other Topics Concern  . Not on file   Social History Narrative    Review of Systems  Constitutional: Negative for fever, chills and weight loss.  Gastrointestinal: Positive for abdominal pain (right lower quadrant, pelvic region). Negative for heartburn, nausea and vomiting.  Neurological: Negative for weakness.  All other systems reviewed and  are negative.     Objective:   Filed Vitals:   02/28/15 1222  BP: 125/80  Pulse: 68  Temp: 98.7 F (37.1 C)  Resp: 18   Physical Exam  Constitutional: She is oriented to person, place, and time.  Cardiovascular: Normal rate, regular rhythm and normal heart sounds.   Pulmonary/Chest: Effort normal and breath sounds normal.  Abdominal: Soft. Bowel sounds are normal. There is tenderness (pelvic exam).  Neurological: She is alert and oriented to person, place, and time.  Skin: Skin is warm and dry.  Psychiatric: She has a normal mood and affect.     Lab Results  Component Value Date   WBC 7.2 02/24/2015   HGB 15.0 02/24/2015   HCT 45.7  02/24/2015   MCV 86.6 02/24/2015   PLT 349 02/24/2015   Lab Results  Component Value Date   CREATININE 0.51 02/24/2015   BUN 7 02/24/2015   NA 138 02/24/2015   K 3.1* 02/24/2015   CL 108 02/24/2015   CO2 27 02/24/2015    No results found for: HGBA1C Lipid Panel  No results found for: CHOL, TRIG, HDL, CHOLHDL, VLDL, LDLCALC     Assessment and plan:   Vikki PortsValerie was seen today for establish care.  Diagnoses and all orders for this visit:  Lower abdominal pain Explained to patient that once she gets orange card, I will place order for GI. If GI is unable to find a source then patient may need Urology referral possibly r/o IC.  Return if symptoms worsen or fail to improve.   Holland CommonsKECK, [place], NP-C [facility] [phone] 02/28/2015, 12:40 PM

## 2015-02-28 NOTE — Progress Notes (Signed)
Establish Care F/U low abdominal pain  Pain worsen with BM

## 2015-02-28 NOTE — Patient Instructions (Signed)
Please call office and leave a message with the nurse so I may give you referral to GI doctor.

## 2015-03-01 ENCOUNTER — Ambulatory Visit: Payer: Self-pay | Admitting: Internal Medicine

## 2015-03-18 ENCOUNTER — Ambulatory Visit: Payer: Self-pay

## 2015-06-02 ENCOUNTER — Encounter (HOSPITAL_BASED_OUTPATIENT_CLINIC_OR_DEPARTMENT_OTHER): Payer: Self-pay | Admitting: Emergency Medicine

## 2015-06-02 ENCOUNTER — Emergency Department (HOSPITAL_BASED_OUTPATIENT_CLINIC_OR_DEPARTMENT_OTHER)
Admission: EM | Admit: 2015-06-02 | Discharge: 2015-06-02 | Disposition: A | Discharge status: Home / Self Care | Payer: 59 | Attending: Emergency Medicine | Admitting: Emergency Medicine

## 2015-06-02 DIAGNOSIS — Z8659 Personal history of other mental and behavioral disorders: Secondary | ICD-10-CM | POA: Diagnosis not present

## 2015-06-02 DIAGNOSIS — Z792 Long term (current) use of antibiotics: Secondary | ICD-10-CM | POA: Insufficient documentation

## 2015-06-02 DIAGNOSIS — M1990 Unspecified osteoarthritis, unspecified site: Secondary | ICD-10-CM | POA: Insufficient documentation

## 2015-06-02 DIAGNOSIS — M5431 Sciatica, right side: Secondary | ICD-10-CM | POA: Diagnosis not present

## 2015-06-02 DIAGNOSIS — M810 Age-related osteoporosis without current pathological fracture: Secondary | ICD-10-CM | POA: Insufficient documentation

## 2015-06-02 DIAGNOSIS — Z862 Personal history of diseases of the blood and blood-forming organs and certain disorders involving the immune mechanism: Secondary | ICD-10-CM | POA: Insufficient documentation

## 2015-06-02 DIAGNOSIS — Z79899 Other long term (current) drug therapy: Secondary | ICD-10-CM | POA: Insufficient documentation

## 2015-06-02 DIAGNOSIS — Z87442 Personal history of urinary calculi: Secondary | ICD-10-CM | POA: Diagnosis not present

## 2015-06-02 DIAGNOSIS — M419 Scoliosis, unspecified: Secondary | ICD-10-CM | POA: Diagnosis not present

## 2015-06-02 DIAGNOSIS — M545 Low back pain: Secondary | ICD-10-CM | POA: Diagnosis present

## 2015-06-02 MED ORDER — OXYCODONE-ACETAMINOPHEN 5-325 MG PO TABS: 2.0000 | ORAL_TABLET | ORAL | Status: DC | PRN

## 2015-06-02 MED ORDER — METHOCARBAMOL 500 MG PO TABS: 500.0000 mg | ORAL_TABLET | Freq: Three times a day (TID) | ORAL | Status: DC | PRN

## 2015-06-02 MED ORDER — PREDNISONE 50 MG PO TABS
60.0000 mg | ORAL_TABLET | Freq: Once | ORAL | Status: AC
  Administered 2015-06-02: 60 mg via ORAL
  Filled 2015-06-02 (×2): qty 1

## 2015-06-02 MED ORDER — OXYCODONE-ACETAMINOPHEN 5-325 MG PO TABS: 1.0000 | ORAL_TABLET | ORAL | Status: DC | PRN

## 2015-06-02 MED ORDER — OXYCODONE-ACETAMINOPHEN 5-325 MG PO TABS
2.0000 | ORAL_TABLET | Freq: Once | ORAL | Status: AC
  Administered 2015-06-02: 2 via ORAL
  Filled 2015-06-02: qty 2

## 2015-06-02 MED ORDER — PREDNISONE 10 MG PO TABS: 60.0000 mg | ORAL_TABLET | Freq: Every day | ORAL | Status: DC

## 2015-06-02 NOTE — Discharge Instructions (Signed)
Sciatica Sciatica is pain, weakness, numbness, or tingling along the path of the sciatic nerve. The nerve starts in the lower back and runs down the back of each leg. The nerve controls the muscles in the lower leg and in the back of the knee, while also providing sensation to the back of the thigh, lower leg, and the sole of your foot. Sciatica is a symptom of another medical condition. For instance, nerve damage or certain conditions, such as a herniated disk or bone spur on the spine, pinch or put pressure on the sciatic nerve. This causes the pain, weakness, or other sensations normally associated with sciatica. Generally, sciatica only affects one side of the body. CAUSES   Herniated or slipped disc.  Degenerative disk disease.  A pain disorder involving the narrow muscle in the buttocks (piriformis syndrome).  Pelvic injury or fracture.  Pregnancy.  Tumor (rare). SYMPTOMS  Symptoms can vary from mild to very severe. The symptoms usually travel from the low back to the buttocks and down the back of the leg. Symptoms can include:  Mild tingling or dull aches in the lower back, leg, or hip.  Numbness in the back of the calf or sole of the foot.  Burning sensations in the lower back, leg, or hip.  Sharp pains in the lower back, leg, or hip.  Leg weakness.  Severe back pain inhibiting movement. These symptoms may get worse with coughing, sneezing, laughing, or prolonged sitting or standing. Also, being overweight may worsen symptoms. DIAGNOSIS  Your caregiver will perform a physical exam to look for common symptoms of sciatica. He or she may ask you to do certain movements or activities that would trigger sciatic nerve pain. Other tests may be performed to find the cause of the sciatica. These may include:  Blood tests.  X-rays.  Imaging tests, such as an MRI or CT scan. TREATMENT  Treatment is directed at the cause of the sciatic pain. Sometimes, treatment is not necessary  and the pain and discomfort goes away on its own. If treatment is needed, your caregiver may suggest:  Over-the-counter medicines to relieve pain.  Prescription medicines, such as anti-inflammatory medicine, muscle relaxants, or narcotics.  Applying heat or ice to the painful area.  Steroid injections to lessen pain, irritation, and inflammation around the nerve.  Reducing activity during periods of pain.  Exercising and stretching to strengthen your abdomen and improve flexibility of your spine. Your caregiver may suggest losing weight if the extra weight makes the back pain worse.  Physical therapy.  Surgery to eliminate what is pressing or pinching the nerve, such as a bone spur or part of a herniated disk. HOME CARE INSTRUCTIONS   Only take over-the-counter or prescription medicines for pain or discomfort as directed by your caregiver.  Apply ice to the affected area for 20 minutes, 3-4 times a day for the first 48-72 hours. Then try heat in the same way.  Exercise, stretch, or perform your usual activities if these do not aggravate your pain.  Attend physical therapy sessions as directed by your caregiver.  Keep all follow-up appointments as directed by your caregiver.  Do not wear high heels or shoes that do not provide proper support.  Check your mattress to see if it is too soft. A firm mattress may lessen your pain and discomfort. SEEK IMMEDIATE MEDICAL CARE IF:   You lose control of your bowel or bladder (incontinence).  You have increasing weakness in the lower back, pelvis, buttocks,   or legs.  You have redness or swelling of your back.  You have a burning sensation when you urinate.  You have pain that gets worse when you lie down or awakens you at night.  Your pain is worse than you have experienced in the past.  Your pain is lasting longer than 4 weeks.  You are suddenly losing weight without reason. MAKE SURE YOU:  Understand these  instructions.  Will watch your condition.  Will get help right away if you are not doing well or get worse. Document Released: 12/01/2001 Document Revised: 06/07/2012 Document Reviewed: 04/17/2012 Exit[employer] Patient Information 2015 [employer]. This information is not intended to replace advice given to you by your health care provider. Make sure you discuss any questions you have with your health care provider.  

## 2015-06-02 NOTE — ED Provider Notes (Signed)
TIME SEEN: 2:50 PM  CHIEF COMPLAINT: Back pain  HPI: Pt is a 44 y.o. female with history of degenerative disc disease, scoliosis who presents to the emergency department with lower back pain that radiates into her right leg. Pain is also in the right buttock. Denies numbness, tingling or focal weakness. No bowel or bladder incontinence. No urinary retention. No fever. No history of injury to the back. Has never had similar symptoms. Pain worse with movement and better with rest. Described as sharp, throbbing. Described as moderate.  ROS: See HPI Constitutional: no fever  Eyes: no drainage  ENT: no runny nose   Cardiovascular:  no chest pain  Resp: no SOB  GI: no vomiting GU: no dysuria Integumentary: no rash  Allergy: no hives  Musculoskeletal: no leg swelling  Neurological: no slurred speech ROS otherwise negative  PAST MEDICAL HISTORY/PAST SURGICAL HISTORY:  Past Medical History  Diagnosis Date  . Degenerative disk disease   . Scoliosis   . Arthritis   . Depression   . Anemia   . Kidney stone   . Kidney stones   . Osteoporosis     MEDICATIONS:  Prior to Admission medications   Medication Sig Start Date End Date Taking? Authorizing Provider  Calcium Carbonate-Vitamin D (CALCIUM + D PO) Take by mouth.    Historical Provider, MD  cephALEXin (KEFLEX) 500 MG capsule Take 1 capsule (500 mg total) by mouth 3 (three) times daily. 02/24/15   Harle Battiest, NP  Cholecalciferol (VITAMIN D3) 3000 UNITS TABS Take by mouth.    Historical Provider, MD  HYDROcodone-acetaminophen (NORCO/VICODIN) 5-325 MG per tablet Take 1 tablet by mouth every 6 (six) hours as needed for moderate pain or severe pain. Patient not taking: Reported on 02/22/2015 02/11/15   Tilden Fossa, MD  methocarbamol (ROBAXIN) 500 MG tablet Take 1 tablet (500 mg total) by mouth every 8 (eight) hours as needed for muscle spasms. 06/02/15   Kristen N Ward, DO  oxyCODONE (ROXICODONE) 5 MG immediate release tablet Take 1  tablet (5 mg total) by mouth every 4 (four) hours as needed for severe pain. Patient not taking: Reported on 02/22/2015 02/10/15   Toy Cookey, MD  oxyCODONE-acetaminophen (PERCOCET/ROXICET) 5-325 MG per tablet Take 1 tablet by mouth every 4 (four) hours as needed. 06/02/15   Kristen N Ward, DO  phenazopyridine (PYRIDIUM) 200 MG tablet Take 1 tablet (200 mg total) by mouth 3 (three) times daily. Patient not taking: Reported on 02/28/2015 02/24/15   Harle Battiest, NP  predniSONE (DELTASONE) 10 MG tablet Take 6 tablets (60 mg total) by mouth daily. Take 60 mg for 1 day, then  for 2 days, then  for 2 days, then  for 2 days, then 20 mg for 2 days then  for 2 days then stop 06/02/15   Kristen N Ward, DO  promethazine (PHENERGAN) 25 MG tablet Take 1 tablet (25 mg total) by mouth every 6 (six) hours as needed for nausea or vomiting. Patient not taking: Reported on 02/22/2015 02/11/15   Tilden Fossa, MD    ALLERGIES:  Allergies  Allergen Reactions  . Morphine And Related Anaphylaxis  . Gabapentin   . Nsaids     Can't take PO NSAIDS due to Bariatric surgery.  . Tramadol     SOCIAL HISTORY:  History  Substance Use Topics  . Smoking status: Never Smoker   . Smokeless tobacco: Not on file  . Alcohol Use: No    FAMILY HISTORY: Family History  Problem Relation Age  of Onset  . Diabetes Father   . Cancer Father   . Osteoporosis Mother     EXAM: BP 138/81 mmHg  Pulse 73  Temp(Src) 98.1 F (36.7 C) (Oral)  Resp 18  Ht 5\' 3"  (1.6 m)  Wt 200 lb (90.719 kg)  BMI 35.44 kg/m2  SpO2 97% CONSTITUTIONAL: Alert and oriented and responds appropriately to questions. Well-appearing; well-nourished HEAD: Normocephalic EYES: Conjunctivae clear, PERRL ENT: normal nose; no rhinorrhea; moist mucous membranes; pharynx without lesions noted NECK: Supple, no meningismus, no LAD  CARD: RRR; S1 and S2 appreciated; no murmurs, no clicks, no rubs, no gallops RESP: Normal chest excursion  without splinting or tachypnea; breath sounds clear and equal bilaterally; no wheezes, no rhonchi, no rales, no hypoxia or respiratory distress, speaking full sentences ABD/GI: Normal bowel sounds; non-distended; soft, non-tender, no rebound, no guarding, no peritoneal signs BACK:  The back appears normal and is non-tender to palpation, there is no CVA tenderness; patient is tender over the right buttock EXT: Normal ROM in all joints; non-tender to palpation; no edema; normal capillary refill; no cyanosis, no calf tenderness or swelling    SKIN: Normal color for age and race; warm NEURO: Moves all extremities equally, sensation to light touch intact diffusely, cranial nerves II through XII intact PSYCH: The patient's mood and manner are appropriate. Grooming and personal hygiene are appropriate.  MEDICAL DECISION MAKING: Patient here with right sciatica possibly from tight piriformis.   Will discharge on Percocet, Robaxin and steroid taper. She cannot take NSAIDs and she has had a gastric bypass. She has no red flag symptoms to suggest that she needs emergent imaging of her spine at this time. Doubt cauda equina, significant spinal stenosis, epidural abscess or hematoma, discitis. Discussed return precautions. She verbalized understanding and is comfortable with this plan.      Layla Maw Ward, DO 06/02/15 1529

## 2015-06-02 NOTE — ED Notes (Signed)
Discharge instructions given and reviewed with patient.  Patient verbalized understanding to take medications as directed and to follow up with PMD as needed.  Patient ambulatory; discharged home in good condition.

## 2015-06-02 NOTE — ED Notes (Signed)
Pt in c/o R back pain x 3 days that goes down the back of her R leg, similar to sciatica.

## 2015-07-25 ENCOUNTER — Encounter (HOSPITAL_BASED_OUTPATIENT_CLINIC_OR_DEPARTMENT_OTHER): Payer: Self-pay

## 2015-07-25 ENCOUNTER — Emergency Department (HOSPITAL_BASED_OUTPATIENT_CLINIC_OR_DEPARTMENT_OTHER): Payer: Medicaid Other

## 2015-07-25 ENCOUNTER — Emergency Department (HOSPITAL_BASED_OUTPATIENT_CLINIC_OR_DEPARTMENT_OTHER)
Admission: EM | Admit: 2015-07-25 | Discharge: 2015-07-25 | Disposition: A | Discharge status: Home / Self Care | Payer: Medicaid Other | Attending: Emergency Medicine | Admitting: Emergency Medicine

## 2015-07-25 DIAGNOSIS — R319 Hematuria, unspecified: Secondary | ICD-10-CM

## 2015-07-25 DIAGNOSIS — Z87442 Personal history of urinary calculi: Secondary | ICD-10-CM | POA: Insufficient documentation

## 2015-07-25 DIAGNOSIS — E669 Obesity, unspecified: Secondary | ICD-10-CM | POA: Insufficient documentation

## 2015-07-25 DIAGNOSIS — R531 Weakness: Secondary | ICD-10-CM | POA: Insufficient documentation

## 2015-07-25 DIAGNOSIS — M810 Age-related osteoporosis without current pathological fracture: Secondary | ICD-10-CM | POA: Insufficient documentation

## 2015-07-25 DIAGNOSIS — Z862 Personal history of diseases of the blood and blood-forming organs and certain disorders involving the immune mechanism: Secondary | ICD-10-CM | POA: Diagnosis not present

## 2015-07-25 DIAGNOSIS — Z8659 Personal history of other mental and behavioral disorders: Secondary | ICD-10-CM | POA: Diagnosis not present

## 2015-07-25 DIAGNOSIS — M549 Dorsalgia, unspecified: Secondary | ICD-10-CM

## 2015-07-25 DIAGNOSIS — M545 Low back pain: Secondary | ICD-10-CM | POA: Diagnosis present

## 2015-07-25 DIAGNOSIS — M419 Scoliosis, unspecified: Secondary | ICD-10-CM | POA: Insufficient documentation

## 2015-07-25 DIAGNOSIS — G8929 Other chronic pain: Secondary | ICD-10-CM | POA: Insufficient documentation

## 2015-07-25 HISTORY — DX: Other chronic pain: G89.29

## 2015-07-25 HISTORY — DX: Dorsalgia, unspecified: M54.9

## 2015-07-25 LAB — URINALYSIS, ROUTINE W REFLEX MICROSCOPIC
Bilirubin Urine: NEGATIVE
Glucose, UA: NEGATIVE mg/dL
Ketones, ur: NEGATIVE mg/dL
Nitrite: NEGATIVE
PH: 5.5 (ref 5.0–8.0)
Protein, ur: NEGATIVE mg/dL
SPECIFIC GRAVITY, URINE: 1.014 (ref 1.005–1.030)
UROBILINOGEN UA: 0.2 mg/dL (ref 0.0–1.0)

## 2015-07-25 LAB — URINE MICROSCOPIC-ADD ON

## 2015-07-25 LAB — BASIC METABOLIC PANEL
ANION GAP: 7 (ref 5–15)
BUN: 11 mg/dL (ref 6–20)
CALCIUM: 8.6 mg/dL — AB (ref 8.9–10.3)
CO2: 25 mmol/L (ref 22–32)
Chloride: 107 mmol/L (ref 101–111)
Creatinine, Ser: 0.54 mg/dL (ref 0.44–1.00)
GFR calc non Af Amer: >60 mL/min (ref >60)
Glucose, Bld: 104 mg/dL — ABNORMAL HIGH (ref 65–99)
Potassium: 3.8 mmol/L (ref 3.5–5.1)
SODIUM: 139 mmol/L (ref 135–145)

## 2015-07-25 LAB — CBC
HCT: 40.6 % (ref 36.0–46.0)
Hemoglobin: 13.1 g/dL (ref 12.0–15.0)
MCH: 27.9 pg (ref 26.0–34.0)
MCHC: 32.3 g/dL (ref 30.0–36.0)
MCV: 86.4 fL (ref 78.0–100.0)
Platelets: 280 10*3/uL (ref 150–400)
RBC: 4.7 MIL/uL (ref 3.87–5.11)
RDW: 14.9 % (ref 11.5–15.5)
WBC: 5.2 10*3/uL (ref 4.0–10.5)

## 2015-07-25 MED ORDER — METHOCARBAMOL 500 MG PO TABS: 500.0000 mg | ORAL_TABLET | Freq: Three times a day (TID) | ORAL | Status: DC | PRN

## 2015-07-25 MED ORDER — DEXAMETHASONE SODIUM PHOSPHATE 10 MG/ML IJ SOLN
10.0000 mg | Freq: Once | INTRAMUSCULAR | Status: AC
  Administered 2015-07-25: 10 mg via INTRAMUSCULAR
  Filled 2015-07-25: qty 1

## 2015-07-25 NOTE — ED Provider Notes (Signed)
CSN: [id number]     Arrival date & time 07/25/15  1354 History   First MD Initiated Contact with Patient 07/25/15 1421     Chief Complaint  Patient presents with  . Back Pain     (Consider location/radiation/quality/duration/timing/severity/associated sxs/prior Treatment) HPI Comments: 44 year old female with a history of chronic back pain over the past 2 years, presenting with gradually worsening lower back pain over the past 4 months. Pain radiates throughout her entire lower back, described as aching, constant, 8/10 radiating to her R buttock. Denies pain, numbness or tingling down her lower extremities. Denies loss of control bowels or bladder saddle anesthesia. No fever, chills or night sweats. Also reports generalized full-body weakness over the past 4 months. States she could not pull open a bag of bacon today which made her upset because she can normally open up bags of bacon without problem. Also states she was with her son and could not pull herself into his seat without extreme effort a few weeks back. No injury or trauma to her back, reports a history of degenerative disc disease. In the past, she has received Percocet, Robaxin and prednisone with great relief of her pain. She has been unable to see her PCP due to insurance reasons and is in the process of getting Medicaid back. Other then being unable to open the back of bacon this morning, nothing in specific made her come to the emergency department. Has not had any unexpected weight changes.  Patient is a 44 y.o. female presenting with back pain. The history is provided by the patient.  Back Pain Associated symptoms: weakness     Past Medical History  Diagnosis Date  . Degenerative disk disease   . Scoliosis   . Arthritis   . Depression   . Anemia   . Kidney stone   . Kidney stones   . Osteoporosis   . Back pain, chronic    Past Surgical History  Procedure Laterality Date  . Abdominal surgery      c-section  .  Cholecystectomy    . Tonsillectomy    . Bariatric surgery    . Abdominal hysterectomy    . C sectiions    . Rectal fiscure     Family History  Problem Relation Age of Onset  . Diabetes Father   . Cancer Father   . Osteoporosis Mother    History  Substance Use Topics  . Smoking status: Never Smoker   . Smokeless tobacco: Not on file  . Alcohol Use: No   OB History    No data available     Review of Systems  Musculoskeletal: Positive for back pain.  Neurological: Positive for weakness.  All other systems reviewed and are negative.     Allergies  Morphine and related; Gabapentin; Nsaids; and Tramadol  Home Medications   Prior to Admission medications   Medication Sig Start Date End Date Taking? Authorizing Provider  Calcium Carbonate-Vitamin D (CALCIUM + D PO) Take by mouth.    Historical Provider, MD  Cholecalciferol (VITAMIN D3) 3000 UNITS TABS Take by mouth.    Historical Provider, MD  methocarbamol (ROBAXIN) 500 MG tablet Take 1 tablet (500 mg total) by mouth every 8 (eight) hours as needed for muscle spasms. 07/25/15   Aasim Restivo M Kourtney Terriquez, PA-C   BP 125/68 mmHg  Pulse 79  Temp(Src) 98.6 F (37 C) (Oral)  Resp 18  Ht  (1.575 m)  Wt 220 lb (99.791 kg)  BMI 40.23 kg/m2  SpO2 100% Physical Exam  Constitutional: She is oriented to person, place, and time. She appears well-developed and well-nourished. No distress.  Obese.  HENT:  Head: Normocephalic and atraumatic.  Mouth/Throat: Oropharynx is clear and moist.  Eyes: Conjunctivae and EOM are normal. Pupils are equal, round, and reactive to light.  Neck: Normal range of motion. Neck supple. No spinous process tenderness and no muscular tenderness present.  Cardiovascular: Normal rate, regular rhythm and normal heart sounds.   Pulmonary/Chest: Effort normal and breath sounds normal. No respiratory distress.  Abdominal: Soft. Bowel sounds are normal. She exhibits no distension. There is no tenderness.   Musculoskeletal: She exhibits no edema.  Tenderness across lower back. No specific point tenderness. No edema, erythema or step-off.  Neurological: She is alert and oriented to person, place, and time. She has normal strength.  Strength upper and lower extremities 5/5 and equal bilateral. Sensation intact. Normal gait.  Skin: Skin is warm and dry. No rash noted. She is not diaphoretic.  Psychiatric: She has a normal mood and affect. Her behavior is normal.  Nursing note and vitals reviewed.   ED Course  Procedures (including critical care time) Labs Review Labs Reviewed  BASIC METABOLIC PANEL - Abnormal; Notable for the following:    Glucose, Bld 104 (*)    Calcium 8.6 (*)    All other components within normal limits  URINALYSIS, ROUTINE W REFLEX MICROSCOPIC (NOT AT [facility]) - Abnormal; Notable for the following:    APPearance CLOUDY (*)    Hgb urine dipstick LARGE (*)    Leukocytes, UA MODERATE (*)    All other components within normal limits  URINE MICROSCOPIC-ADD ON - Abnormal; Notable for the following:    Bacteria, UA FEW (*)    All other components within normal limits  CBC    Imaging Review Dg Lumbar Spine Complete  07/25/2015   CLINICAL DATA:  Chronic lumbago with right-sided radicular symptoms  EXAM: LUMBAR SPINE - COMPLETE 4+ VIEW  COMPARISON:  July 23, 2013  FINDINGS: Frontal, lateral, spot lumbosacral lateral, and bilateral oblique views were obtained. There are 5 non-rib-bearing lumbar type vertebral bodies. There is no fracture or spondylolisthesis. Disc spaces appear normal. There is no appreciable facet arthropathy.  IMPRESSION: No fracture or spondylolisthesis. No appreciable arthropathic change.   Electronically Signed   By: Bretta Bang III M.D.   On: 07/25/2015 16:09     EKG Interpretation None      MDM   Final diagnoses:  Chronic back pain  Generalized weakness  Hematuria   Non-toxic appearing, NAD. AFVSS. No red flags concerning patient's back  pain. No s/s of central cord compression or cauda equina. Lower extremities are neurovascularly intact and patient is ambulating without difficulty. Pain ongoing for 2 years. No fever, chills, night sweats. Regarding generalized weakness, this is also been ongoing for a while. Basic labs obtained today without any acute findings. UA significant for hematuria. No infection. There are some leukocytes. Patient has not experienced any vaginal discharge. History of hysterectomy. Advised the patient to follow-up with her PCP once her insurance comes into place which should be seen to discuss the hematuria and her generalized weakness and chronic back pain. Will give a shot of Decadron as directed helped her in the past, along with an Rx for Robaxin. Patient is stable for discharge. Return precautions given. Patient states understanding of treatment care plan and is agreeable.  Kathrynn Speed, PA-C 07/25/15 1648  Rolan Bucco, MD 07/26/15 [phone]

## 2015-07-25 NOTE — ED Notes (Signed)
C/o hx of chronic back pain-states she has noticed weakness in her arms over the last 3-4 months-pt NAD-steady gait into triage

## 2015-07-25 NOTE — Discharge Instructions (Signed)
Take robaxin as prescribed for muscle spasm. No driving or operating heavy machinery while taking this drug as it may cause drowsiness.  Chronic Back Pain  When back pain lasts longer than 3 months, it is called chronic back pain.People with chronic back pain often go through certain periods that are more intense (flare-ups).  CAUSES Chronic back pain can be caused by wear and tear (degeneration) on different structures in your back. These structures include:  The bones of your spine (vertebrae) and the joints surrounding your spinal cord and nerve roots (facets).  The strong, fibrous tissues that connect your vertebrae (ligaments). Degeneration of these structures may result in pressure on your nerves. This can lead to constant pain. HOME CARE INSTRUCTIONS  Avoid bending, heavy lifting, prolonged sitting, and activities which make the problem worse.  Take brief periods of rest throughout the day to reduce your pain. Lying down or standing usually is better than sitting while you are resting.  Take over-the-counter or prescription medicines only as directed by your caregiver. SEEK IMMEDIATE MEDICAL CARE IF:   You have weakness or numbness in one of your legs or feet.  You have trouble controlling your bladder or bowels.  You have nausea, vomiting, abdominal pain, shortness of breath, or fainting. Document Released: 01/14/2005 Document Revised: 02/29/2012 Document Reviewed: 11/21/2011 [facility] Patient Information 2015 [place], [place]. This information is not intended to replace advice given to you by your health care provider. Make sure you discuss any questions you have with your health care provider.  Weakness Weakness is a lack of strength. It may be felt all over the body (generalized) or in one specific part of the body (focal). Some causes of weakness can be serious. You may need further medical evaluation, especially if you are elderly or you have a history of immunosuppression  (such as chemotherapy or HIV), kidney disease, heart disease, or diabetes. CAUSES  Weakness can be caused by many different things, including:  Infection.  Physical exhaustion.  Internal bleeding or other blood loss that results in a lack of red blood cells (anemia).  Dehydration. This cause is more common in elderly people.  Side effects or electrolyte abnormalities from medicines, such as pain medicines or sedatives.  Emotional distress, anxiety, or depression.  Circulation problems, especially severe peripheral arterial disease.  Heart disease, such as rapid atrial fibrillation, bradycardia, or heart failure.  Nervous system disorders, such as Guillain-Barr syndrome, multiple sclerosis, or stroke. DIAGNOSIS  To find the cause of your weakness, your caregiver will take your history and perform a physical exam. Lab tests or X-rays may also be ordered, if needed. TREATMENT  Treatment of weakness depends on the cause of your symptoms and can vary greatly. HOME CARE INSTRUCTIONS   Rest as needed.  Eat a well-balanced diet.  Try to get some exercise every day.  Only take over-the-counter or prescription medicines as directed by your caregiver. SEEK MEDICAL CARE IF:   Your weakness seems to be getting worse or spreads to other parts of your body.  You develop new aches or pains. SEEK IMMEDIATE MEDICAL CARE IF:   You cannot perform your normal daily activities, such as getting dressed and feeding yourself.  You cannot walk up and down stairs, or you feel exhausted when you do so.  You have shortness of breath or chest pain.  You have difficulty moving parts of your body.  You have weakness in only one area of the body or on only one side of the body.  You have a fever.  You have trouble speaking or swallowing.  You cannot control your bladder or bowel movements.  You have black or bloody vomit or stools. MAKE SURE YOU:  Understand these instructions.  Will  watch your condition.  Will get help right away if you are not doing well or get worse. Document Released: 12/07/2005 Document Revised: 06/07/2012 Document Reviewed: 02/05/2012 [facility] Patient Information 2015 [place], [place]. This information is not intended to replace advice given to you by your health care provider. Make sure you discuss any questions you have with your health care provider.

## 2016-08-09 ENCOUNTER — Encounter (HOSPITAL_BASED_OUTPATIENT_CLINIC_OR_DEPARTMENT_OTHER): Payer: Self-pay | Admitting: *Deleted

## 2016-08-09 ENCOUNTER — Emergency Department (HOSPITAL_BASED_OUTPATIENT_CLINIC_OR_DEPARTMENT_OTHER)
Admission: EM | Admit: 2016-08-09 | Discharge: 2016-08-09 | Disposition: A | Discharge status: Home / Self Care | Payer: Medicaid Other | Attending: Emergency Medicine | Admitting: Emergency Medicine

## 2016-08-09 DIAGNOSIS — M26601 Right temporomandibular joint disorder, unspecified: Secondary | ICD-10-CM | POA: Insufficient documentation

## 2016-08-09 DIAGNOSIS — M26609 Unspecified temporomandibular joint disorder, unspecified side: Secondary | ICD-10-CM

## 2016-08-09 NOTE — ED Triage Notes (Signed)
Pt c/o sharp pain in her right jaw that has spread to her right ear.

## 2016-08-09 NOTE — ED Notes (Signed)
Pt c/o right side face, jaw and ear pain. Worse with eating x 2 days

## 2016-08-09 NOTE — Discharge Instructions (Signed)
Please wear mouth guard, reduce amount of hard foods that she come and use over-the-counter pain medication as needed for discomfort. Please follow-up with her primary care or dentist for reevaluation. Please return to emergency room immediately if he expands any new or worsening signs or symptoms

## 2016-08-09 NOTE — ED Provider Notes (Signed)
[facility] Provider Note   CSN: [id number] Arrival date & time: 08/09/16  1259  By signing my name below, I, Catherine Gallegos, attest that this documentation has been prepared under the direction and in the presence of non-physician practitioner, Eyvonne MechanicJeffrey Amamda Curbow, PA-C,. Electronically Signed: Nelwyn SalisburyJoshua Gallegos, Scribe. 08/09/2016. 4:41 PM.  History   Chief Complaint Chief Complaint  Patient presents with  . Jaw Pain   The history is provided by the patient. No language interpreter was used.   HPI Comments:  Catherine Gallegos is a 45 y.o. female who presents to the Emergency Department complaining of constant worsening right-sided stabbing jaw pain onset 5 days ago. She indicates the pain started as intermittent but has become constant today. She notes her pain is worsened by chewing, jaw movement, and palpation. No alleviating factors noted. Pt denies any recent fever or chills.   Past Medical History:  Diagnosis Date  . Anemia   . Arthritis   . Back pain, chronic   . Degenerative disk disease   . Depression   . Kidney stone   . Kidney stones   . Osteoporosis   . Scoliosis     Patient Active Problem List   Diagnosis Date Noted  . Abdominal pain 02/22/2015    Past Surgical History:  Procedure Laterality Date  . ABDOMINAL HYSTERECTOMY    . ABDOMINAL SURGERY     c-section  . BARIATRIC SURGERY    . c sectiions    . CHOLECYSTECTOMY    . rectal fiscure    . TONSILLECTOMY      OB History    No data available       Home Medications    Prior to Admission medications   Medication Sig Start Date End Date Taking? Authorizing Provider  Calcium Carbonate-Vitamin D (CALCIUM + D PO) Take by mouth.    Historical Provider, MD  Cholecalciferol (VITAMIN D3) 3000 UNITS TABS Take by mouth.    Historical Provider, MD  methocarbamol (ROBAXIN) 500 MG tablet Take 1 tablet (500 mg total) by mouth every 8 (eight) hours as needed for muscle spasms. 07/25/15   Catherine Speedobyn M Hess, PA-C     Family History Family History  Problem Relation Age of Onset  . Diabetes Father   . Cancer Father   . Osteoporosis Mother     Social History Social History  Substance Use Topics  . Smoking status: Never Smoker  . Smokeless tobacco: Never Used  . Alcohol use No     Allergies   Morphine and related; Gabapentin; Nsaids; and Tramadol   Review of Systems Review of Systems  Constitutional: Negative for chills and fever.  Musculoskeletal: Positive for myalgias.  All other systems reviewed and are negative.  Physical Exam Updated Vital Signs BP 144/87 (BP Location: Right Arm)   Pulse 62   Temp 98.7 F (37.1 C) (Oral)   Resp 18   Ht 5\' 3"  (1.6 m)   Wt 99.8 kg   SpO2 100%   BMI 38.97 kg/m   Physical Exam  Constitutional: She is oriented to person, place, and time. She appears well-developed and well-nourished. No distress.  HENT:  Head: Normocephalic and atraumatic.  Right Ear: External ear normal.  Left Ear: External ear normal.  Nose: Nose normal.  Mouth/Throat: Oropharynx is clear and moist.  No external auditory canal abnormalities. TM intact. No pain to palpation of tragus or mastoid. Exquisite tenderness to right TMJ worsened with closing jaw. No surrounding redness.   Oral exam  shows no infection. Neck supple with active ROM  Eyes: Conjunctivae are normal.  Cardiovascular: Normal rate.   Pulmonary/Chest: Effort normal.  Abdominal: She exhibits no distension.  Neurological: She is alert and oriented to person, place, and time.  Skin: Skin is warm and dry.  Psychiatric: She has a normal mood and affect.  Nursing note and vitals reviewed.  ED Treatments / Results  DIAGNOSTIC STUDIES:  Oxygen Saturation is 100% on RA, normal by my interpretation.    COORDINATION OF CARE:  4:41 PM Discussed treatment plan with pt at bedside which included purchase of mouthguard and anti-inflammatory drugs and pt agreed to plan.  Labs (all labs ordered are listed,  but only abnormal results are displayed) Labs Reviewed - No data to display  EKG  EKG Interpretation None       Radiology No results found.  Procedures Procedures (including critical care time)  Medications Ordered in ED Medications - No data to display   Initial Impression / Assessment and Plan / ED Course  I have reviewed the triage vital signs and the nursing notes.  Pertinent labs & imaging results that were available during my care of the patient were reviewed by me and considered in my medical decision making (see chart for details).  Clinical Course    Final Clinical Impressions(s) / ED Diagnoses   Final diagnoses:  TMJ (temporomandibular joint disorder)   Labs:   Imaging:   Consults:   Therapeutics:   Discharge Meds:  Assessment/Plan:    Patient's presentation was consistent with TMJ pain. Tender to palpation, worse with closing of the jaw. Patient has no infectious etiology, she was instructed to use mouthpiece, anti-inflammatories, follow-up with her dentist and primary care for reevaluation. She is given strict return precautions. She verbalized understanding and agreement today's plan  New Prescriptions Discharge Medication List as of 08/09/2016  5:29 PM     I personally performed the services described in this documentation, which was scribed in my presence. The recorded information has been reviewed and is accurate.        Eyvonne MechanicJeffrey Nadira Single, PA-C 08/10/16 0142    Catherine Nayobert Beaton, MD 08/12/16 1344

## 2016-08-09 NOTE — ED Notes (Signed)
Given d/c instructions. Verbalizes understanding. No questions. 

## 2016-10-23 IMAGING — CT CT ABD-PELV W/ CM
2 of 5 series · 17 of 46 positions shown, 19 images · IV contrast (omnipaque)
Comparison: 08/06/2014

CLINICAL DATA: Right lower quadrant abdominal pain.

EXAM:
CT ABDOMEN AND PELVIS WITH CONTRAST
TECHNIQUE: Multidetector CT imaging of the abdomen and pelvis was performed
using the standard protocol following bolus administration of
intravenous contrast.
CONTRAST:  25mL OMNIPAQUE IOHEXOL 300 MG/ML SOLN, 100mL OMNIPAQUE
IOHEXOL 300 MG/ML SOLN

[Series 2: abd/pelvis 5.0 b31f · axial · 0.72mm/px · z∈[-521,-146]mm · 14 of 85 slices shown, 16 images]
[im 5/85  soft-tissue]
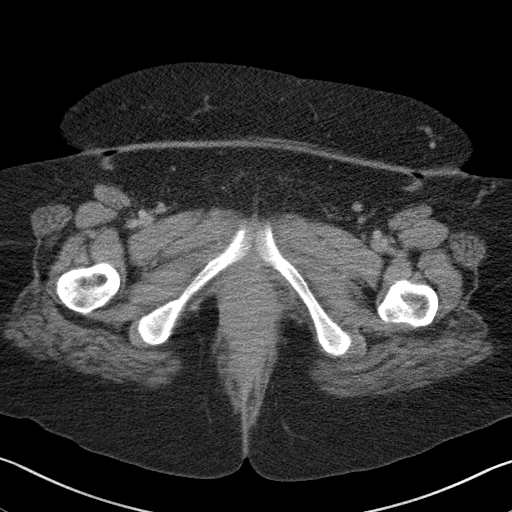
[im 5/85  bone]
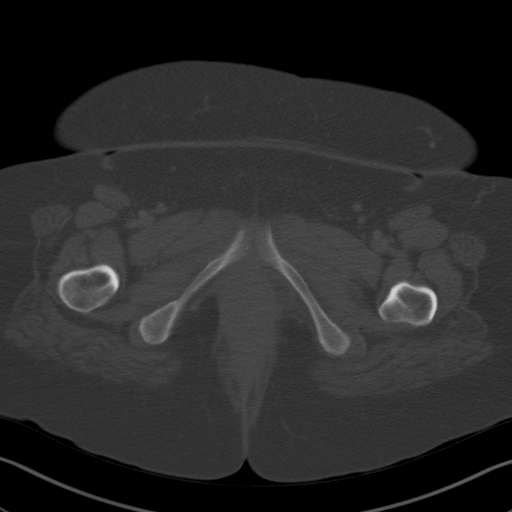
[im 10/85  soft-tissue]
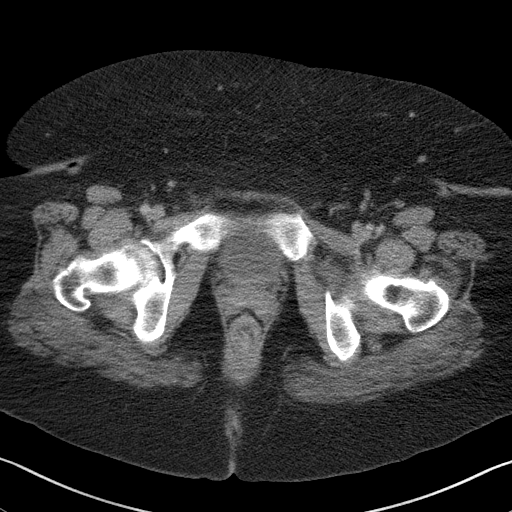
[im 19/85  soft-tissue]
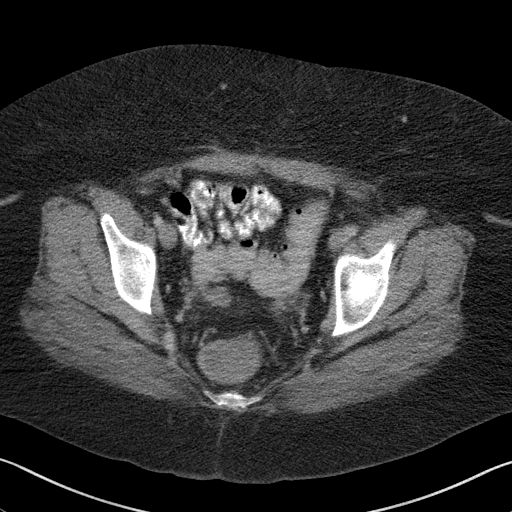
[im 24/85  soft-tissue]
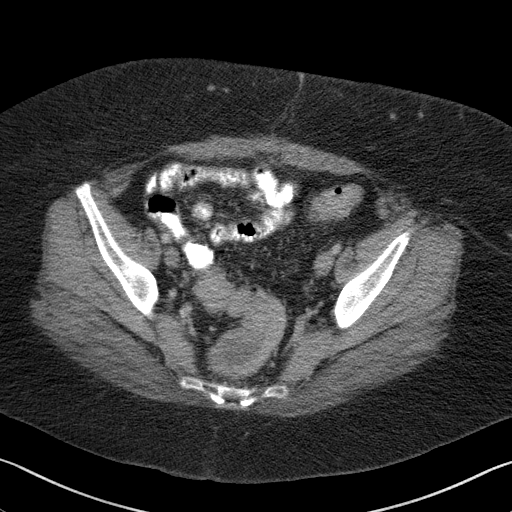
[im 29/85  soft-tissue]
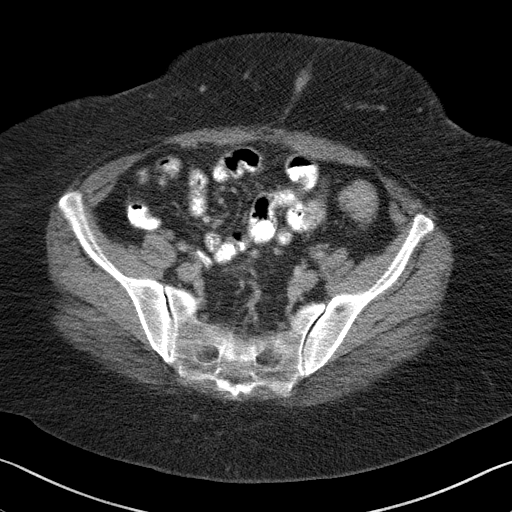
[im 33/85  soft-tissue]
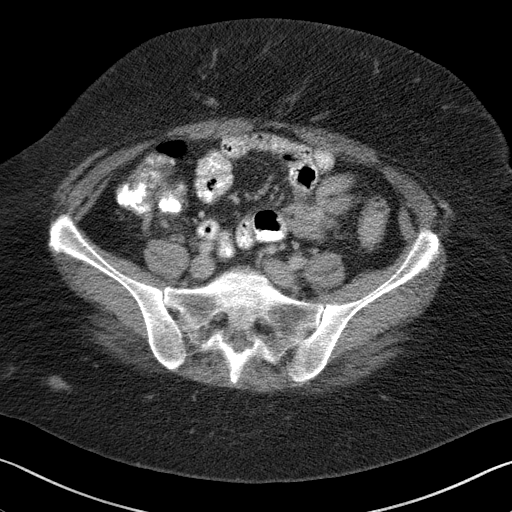
[im 38/85  soft-tissue]
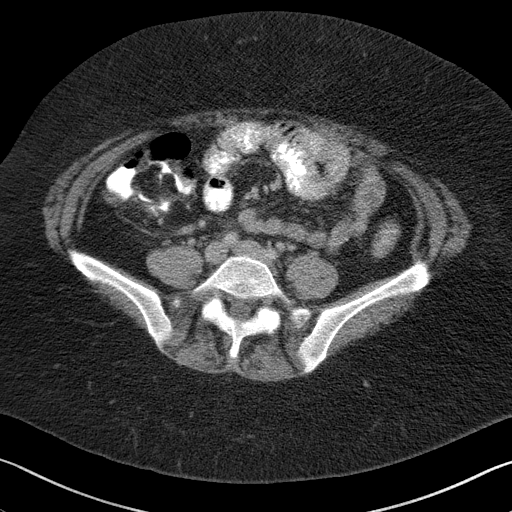
[im 47/85  soft-tissue]
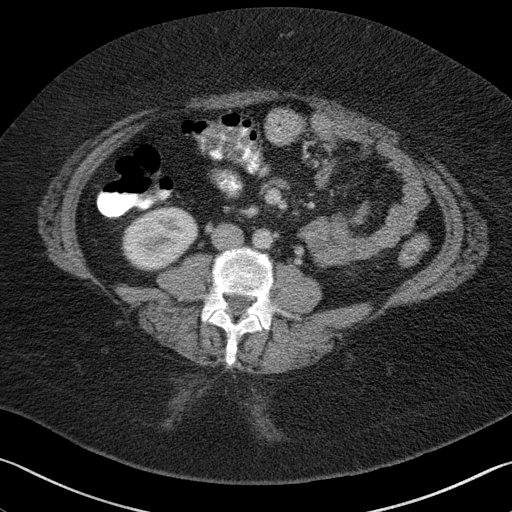
[im 52/85  soft-tissue]
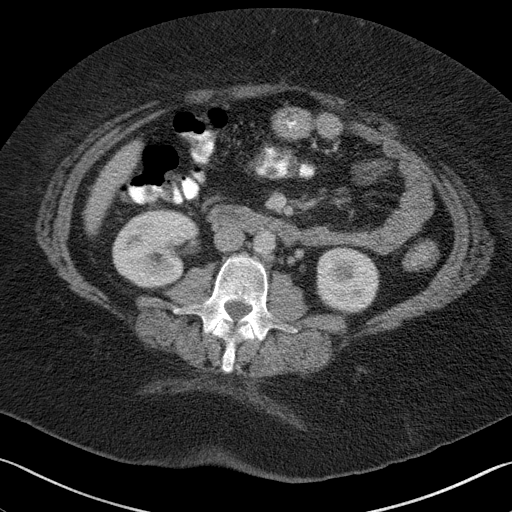
[im 52/85  bone]
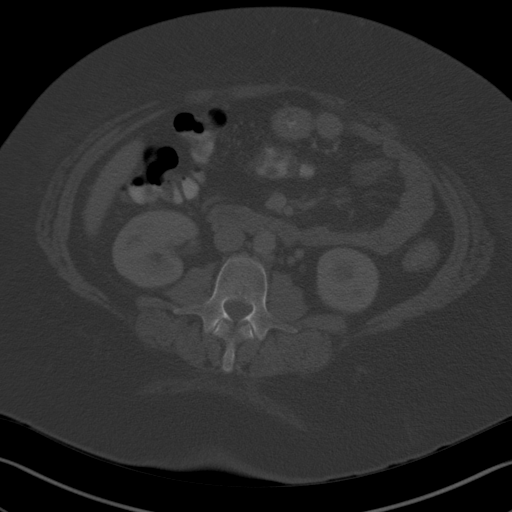
[im 57/85  soft-tissue]
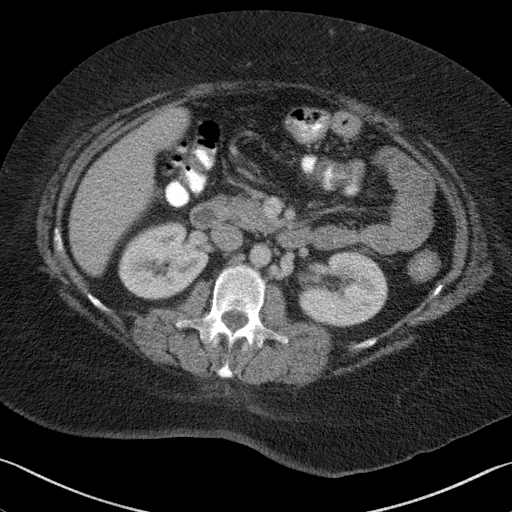
[im 61/85  soft-tissue]
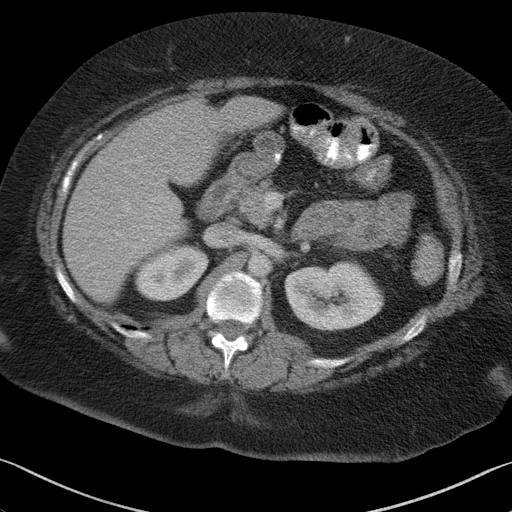
[im 66/85  soft-tissue]
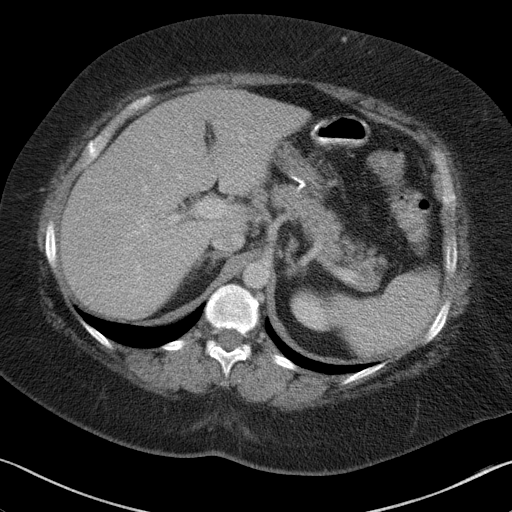
[im 75/85  soft-tissue]
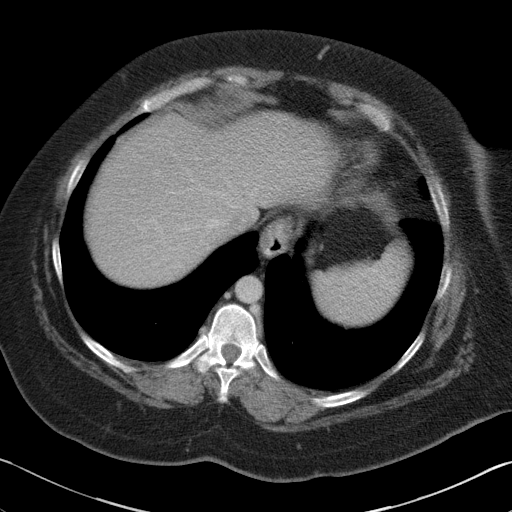
[im 80/85  soft-tissue]
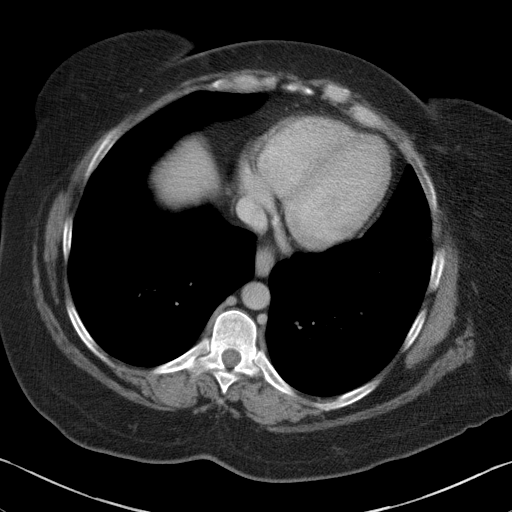

[Series 5: abd/pelvis 3.0 coronal · coronal · 0.79mm/px · 3 of 108 slices shown]
[im 36/108  soft-tissue]
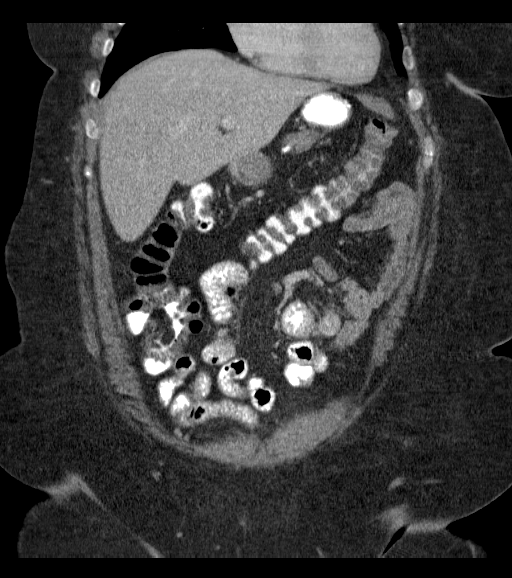
[im 48/108  soft-tissue]
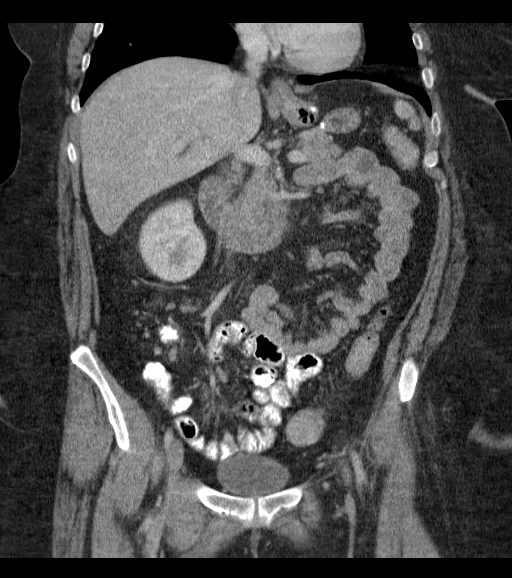
[im 60/108  soft-tissue]
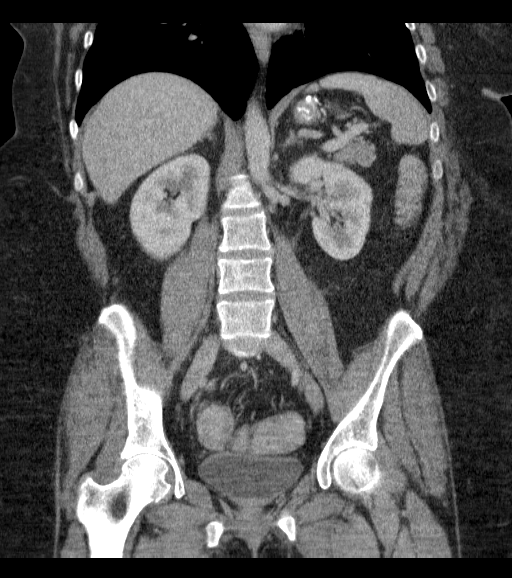

[17 of 46 positions shown; findings below may reference images not displayed]

FINDINGS: BODY WALL: Unremarkable.

LOWER CHEST: Tiny sliding hiatal hernia.

ABDOMEN/PELVIS:

Liver: No focal abnormality.

Biliary: Cholecystectomy. No evidence of biliary obstruction or
stone.

Pancreas: Unremarkable.

Spleen: Unremarkable.

Adrenals: Unremarkable.

Kidneys and ureters: No hydronephrosis or stone.

Bladder: Unremarkable.

Reproductive: Hysterectomy and oophorectomies.

Bowel: Status post gastric bypass. The gastric pouch is somewhat
prominent in size, but there is no evidence of gastrogastric fistula
or marginal ulcer. No small bowel obstruction. Normal appendix.

Retroperitoneum: No mass or adenopathy.

Peritoneum: No ascites or pneumoperitoneum.

Vascular: No acute abnormality.

OSSEOUS: No acute abnormalities.
IMPRESSION: 1. No appendicitis or other finding to explain right lower quadrant
pain.
2. Gastric bypass.

## 2018-04-12 ENCOUNTER — Other Ambulatory Visit: Payer: Self-pay

## 2018-04-12 ENCOUNTER — Encounter (HOSPITAL_BASED_OUTPATIENT_CLINIC_OR_DEPARTMENT_OTHER): Payer: Self-pay | Admitting: *Deleted

## 2018-04-12 ENCOUNTER — Emergency Department (HOSPITAL_BASED_OUTPATIENT_CLINIC_OR_DEPARTMENT_OTHER)
Admission: EM | Admit: 2018-04-12 | Discharge: 2018-04-12 | Disposition: A | Discharge status: Home / Self Care | Payer: Self-pay | Attending: Emergency Medicine | Admitting: Emergency Medicine

## 2018-04-12 ENCOUNTER — Emergency Department (HOSPITAL_BASED_OUTPATIENT_CLINIC_OR_DEPARTMENT_OTHER): Payer: Self-pay

## 2018-04-12 DIAGNOSIS — R109 Unspecified abdominal pain: Secondary | ICD-10-CM | POA: Insufficient documentation

## 2018-04-12 DIAGNOSIS — Z79899 Other long term (current) drug therapy: Secondary | ICD-10-CM | POA: Insufficient documentation

## 2018-04-12 DIAGNOSIS — R300 Dysuria: Secondary | ICD-10-CM | POA: Insufficient documentation

## 2018-04-12 LAB — CBC WITH DIFFERENTIAL/PLATELET
Basophils Absolute: 0 10*3/uL (ref 0.0–0.1)
Basophils Relative: 0 %
Eosinophils Absolute: 0.1 10*3/uL (ref 0.0–0.7)
Eosinophils Relative: 2 %
HCT: 43.6 % (ref 36.0–46.0)
Hemoglobin: 14.8 g/dL (ref 12.0–15.0)
LYMPHS ABS: 2.4 10*3/uL (ref 0.7–4.0)
Lymphocytes Relative: 38 %
MCH: 31.5 pg (ref 26.0–34.0)
MCHC: 33.9 g/dL (ref 30.0–36.0)
MCV: 92.8 fL (ref 78.0–100.0)
MONOS PCT: 6 %
Monocytes Absolute: 0.4 10*3/uL (ref 0.1–1.0)
NEUTROS PCT: 54 %
Neutro Abs: 3.5 10*3/uL (ref 1.7–7.7)
Platelets: 302 10*3/uL (ref 150–400)
RBC: 4.7 MIL/uL (ref 3.87–5.11)
RDW: 13.6 % (ref 11.5–15.5)
WBC: 6.3 10*3/uL (ref 4.0–10.5)

## 2018-04-12 LAB — URINALYSIS, ROUTINE W REFLEX MICROSCOPIC
BILIRUBIN URINE: NEGATIVE
Glucose, UA: NEGATIVE mg/dL
KETONES UR: NEGATIVE mg/dL
Leukocytes, UA: NEGATIVE
Nitrite: NEGATIVE
Protein, ur: NEGATIVE mg/dL
Specific Gravity, Urine: >1.03 — ABNORMAL HIGH (ref 1.005–1.030)
pH: 6 (ref 5.0–8.0)

## 2018-04-12 LAB — BASIC METABOLIC PANEL
Anion gap: 8 (ref 5–15)
BUN: 9 mg/dL (ref 6–20)
CO2: 21 mmol/L — AB (ref 22–32)
Calcium: 8.4 mg/dL — ABNORMAL LOW (ref 8.9–10.3)
Chloride: 107 mmol/L (ref 101–111)
Creatinine, Ser: 0.49 mg/dL (ref 0.44–1.00)
GFR calc non Af Amer: >60 mL/min (ref >60)
Glucose, Bld: 97 mg/dL (ref 65–99)
POTASSIUM: 3.8 mmol/L (ref 3.5–5.1)
Sodium: 136 mmol/L (ref 135–145)

## 2018-04-12 LAB — URINALYSIS, MICROSCOPIC (REFLEX)

## 2018-04-12 MED ORDER — METHOCARBAMOL 500 MG PO TABS: 500.0000 mg | ORAL_TABLET | Freq: Three times a day (TID) | ORAL | 0 refills | Status: DC | PRN

## 2018-04-12 NOTE — ED Triage Notes (Signed)
Right flank pain for a few days. States she thinks she has a UTI.

## 2018-04-12 NOTE — ED Provider Notes (Signed)
[facility] EMERGENCY DEPARTMENT Provider Note   CSN: [id number] Arrival date & time: 04/12/18  1258     History   Chief Complaint Chief Complaint  Patient presents with  . Flank Pain    HPI Catherine Gallegos is a 47 y.o. female.  HPI Patient presents with right flank pain for the last few days.  Feels like previous urinary tract infections.  No fevers or chills.  Has had some dysuria.  Has a history of kidney stones states this pain does not feel the same.  States she has had kidney infections in the past.  No chest pain.  No trouble breathing.  No fevers or chills.  No nausea or vomiting.  No trauma.  Pain is worse with certain movements. Past Medical History:  Diagnosis Date  . Anemia   . Arthritis   . Back pain, chronic   . Degenerative disk disease   . Depression   . Kidney stone   . Kidney stones   . Osteoporosis   . Scoliosis     Patient Active Problem List   Diagnosis Date Noted  . Abdominal pain 02/22/2015    Past Surgical History:  Procedure Laterality Date  . ABDOMINAL HYSTERECTOMY    . ABDOMINAL SURGERY     c-section  . BARIATRIC SURGERY    . c sectiions    . CHOLECYSTECTOMY    . rectal fiscure    . TONSILLECTOMY       OB History   None      Home Medications    Prior to Admission medications   Medication Sig Start Date End Date Taking? Authorizing Provider  Calcium Carbonate-Vitamin D (CALCIUM + D PO) Take by mouth.    [provider]  Cholecalciferol (VITAMIN D3) 3000 UNITS TABS Take by mouth.    [provider]  methocarbamol (ROBAXIN) 500 MG tablet Take 1 tablet (500 mg total) by mouth every 8 (eight) hours as needed for muscle spasms. 04/12/18   Benjiman CorePickering, Izik Bingman, MD    Family History Family History  Problem Relation Age of Onset  . Diabetes Father   . Cancer Father   . Osteoporosis Mother     Social History Social History   Tobacco Use  . Smoking status: Never Smoker  . Smokeless tobacco: Never  Used  Substance Use Topics  . Alcohol use: No  . Drug use: No     Allergies   Morphine and related; Gabapentin; Nsaids; and Tramadol   Review of Systems Review of Systems  Constitutional: Negative for appetite change and fever.  HENT: Negative for congestion.   Respiratory: Negative for shortness of breath.   Cardiovascular: Negative for chest pain.  Gastrointestinal: Negative for abdominal pain and rectal pain.  Genitourinary: Positive for dysuria and flank pain.  Musculoskeletal: Negative for back pain.  Skin: Negative for rash.  Neurological: Negative for tremors.  Hematological: Negative for adenopathy.  Psychiatric/Behavioral: Negative for confusion.     Physical Exam Updated Vital Signs BP 140/83   Pulse 73   Temp 98.1 F (36.7 C) (Oral)   Resp 18   Ht 5\' 3"  (1.6 m)   Wt 117.9 kg (260 lb)   SpO2 100%   BMI 46.06 kg/m   Physical Exam  Constitutional: She appears well-developed.  HENT:  Head: Atraumatic.  Eyes: Pupils are equal, round, and reactive to light.  Neck: Neck supple.  Cardiovascular: Normal rate.  Pulmonary/Chest: Effort normal.  Abdominal: There is no tenderness.  Genitourinary:  Genitourinary Comments: CVA tenderness on right side  Neurological: She is alert.  Skin: Skin is warm. Capillary refill takes less than 2 seconds.     ED Treatments / Results  Labs (all labs ordered are listed, but only abnormal results are displayed) Labs Reviewed  URINALYSIS, ROUTINE W REFLEX MICROSCOPIC - Abnormal; Notable for the following components:      Result Value   APPearance CLOUDY (*)    Specific Gravity, Urine >1.030 (*)    Hgb urine dipstick LARGE (*)    All other components within normal limits  URINALYSIS, MICROSCOPIC (REFLEX) - Abnormal; Notable for the following components:   Bacteria, UA RARE (*)    All other components within normal limits  BASIC METABOLIC PANEL - Abnormal; Notable for the following components:   CO2 21 (*)    Calcium  8.4 (*)    All other components within normal limits  CBC WITH DIFFERENTIAL/PLATELET    EKG None  Radiology Ct Renal Stone Study  Result Date: 04/12/2018 CLINICAL DATA:  Flank pain.  Recurrent stone disease suspected. EXAM: CT ABDOMEN AND PELVIS WITHOUT CONTRAST TECHNIQUE: Multidetector CT imaging of the abdomen and pelvis was performed following the standard protocol without IV contrast. COMPARISON:  04/01/2018 FINDINGS: Lower chest:  No contributory findings. Hepatobiliary: No focal liver abnormality.No evidence of biliary obstruction or stone. Pancreas: Unremarkable. Spleen: Unremarkable. Adrenals/Urinary Tract: Negative adrenals. No hydronephrosis or stone. Unremarkable bladder. Stomach/Bowel: Small sliding hiatal hernia. Status post gastric bypass. No obstruction or inflammatory changes. No appendicitis. Vascular/Lymphatic: No acute vascular abnormality. No mass or adenopathy. Reproductive:Hysterectomy. Other: No ascites or pneumoperitoneum. Musculoskeletal: No acute abnormalities. IMPRESSION: No explanation for abdominal pain. No urolithiasis or hydronephrosis. Electronically Signed   By: Marnee Spring M.D.   On: 04/12/2018 14:11    Procedures Procedures (including critical care time)  Medications Ordered in ED Medications - No data to display   Initial Impression / Assessment and Plan / ED Course  I have reviewed the triage vital signs and the nursing notes.  Pertinent labs & imaging results that were available during my care of the patient were reviewed by me and considered in my medical decision making (see chart for details).    Patient with right flank pain.  Hematuria without clear sign of infection.  CT scan done to previous stones and does not show cause of the pain.  Labs reassuring.  Will discharge home.  Will not give NSAIDs due to previous bariatric surgery.  Follow-up with PCP as needed for Final Clinical Impressions(s) / ED Diagnoses   Final diagnoses:  Flank pain     ED Discharge Orders        Ordered    methocarbamol (ROBAXIN) 500 MG tablet  Every 8 hours PRN     04/12/18 1436       Benjiman Core, MD 04/12/18 1443

## 2018-06-02 DIAGNOSIS — R0781 Pleurodynia: Secondary | ICD-10-CM

## 2018-06-02 DIAGNOSIS — R05 Cough: Secondary | ICD-10-CM

## 2018-06-02 DIAGNOSIS — J441 Chronic obstructive pulmonary disease with (acute) exacerbation: Secondary | ICD-10-CM

## 2020-07-12 ENCOUNTER — Emergency Department (HOSPITAL_BASED_OUTPATIENT_CLINIC_OR_DEPARTMENT_OTHER)
Admission: EM | Admit: 2020-07-12 | Discharge: 2020-07-12 | Disposition: A | Discharge status: Home / Self Care | Payer: BLUE CROSS/BLUE SHIELD | Attending: Emergency Medicine | Admitting: Emergency Medicine

## 2020-07-12 ENCOUNTER — Encounter (HOSPITAL_BASED_OUTPATIENT_CLINIC_OR_DEPARTMENT_OTHER): Payer: Self-pay

## 2020-07-12 ENCOUNTER — Other Ambulatory Visit: Payer: Self-pay

## 2020-07-12 DIAGNOSIS — M7989 Other specified soft tissue disorders: Secondary | ICD-10-CM | POA: Diagnosis not present

## 2020-07-12 DIAGNOSIS — M79661 Pain in right lower leg: Secondary | ICD-10-CM | POA: Insufficient documentation

## 2020-07-12 DIAGNOSIS — Z5321 Procedure and treatment not carried out due to patient leaving prior to being seen by health care provider: Secondary | ICD-10-CM | POA: Diagnosis not present

## 2020-07-12 HISTORY — DX: Intestinal malabsorption, unspecified: K90.9

## 2020-07-12 NOTE — ED Notes (Signed)
Xray went to take patient for ultrasound.  Pt had already left the department per registration

## 2020-07-12 NOTE — ED Triage Notes (Signed)
Pt c/o R lower leg swelling and pain. Pt reports a lump on calf. Pt saw PCP and was started on Prednisone without relief.

## 2020-07-13 ENCOUNTER — Emergency Department (HOSPITAL_BASED_OUTPATIENT_CLINIC_OR_DEPARTMENT_OTHER): Payer: BLUE CROSS/BLUE SHIELD

## 2020-07-13 ENCOUNTER — Encounter (HOSPITAL_BASED_OUTPATIENT_CLINIC_OR_DEPARTMENT_OTHER): Payer: Self-pay | Admitting: Emergency Medicine

## 2020-07-13 ENCOUNTER — Emergency Department (HOSPITAL_BASED_OUTPATIENT_CLINIC_OR_DEPARTMENT_OTHER)
Admission: EM | Admit: 2020-07-13 | Discharge: 2020-07-13 | Disposition: A | Discharge status: Home / Self Care | Payer: BLUE CROSS/BLUE SHIELD | Attending: Emergency Medicine | Admitting: Emergency Medicine

## 2020-07-13 DIAGNOSIS — M818 Other osteoporosis without current pathological fracture: Secondary | ICD-10-CM | POA: Insufficient documentation

## 2020-07-13 DIAGNOSIS — M549 Dorsalgia, unspecified: Secondary | ICD-10-CM | POA: Insufficient documentation

## 2020-07-13 DIAGNOSIS — M25461 Effusion, right knee: Secondary | ICD-10-CM | POA: Diagnosis not present

## 2020-07-13 DIAGNOSIS — M25561 Pain in right knee: Secondary | ICD-10-CM | POA: Diagnosis present

## 2020-07-13 MED ORDER — HYDROCODONE-ACETAMINOPHEN 5-325 MG PO TABS: 1.0000 | ORAL_TABLET | ORAL | 0 refills | Status: DC | PRN

## 2020-07-13 MED ORDER — HYDROCODONE-ACETAMINOPHEN 5-325 MG PO TABS
2.0000 | ORAL_TABLET | Freq: Once | ORAL | Status: AC
  Administered 2020-07-13: 2 via ORAL
  Filled 2020-07-13: qty 2

## 2020-07-13 NOTE — Discharge Instructions (Addendum)
If you develop fever, redness over your knee, new or worsening swelling, uncontrolled pain, inability to bend or straighten, or any other new/concerning symptoms then return to the ER for evaluation.
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
## 2020-07-13 NOTE — ED Triage Notes (Signed)
Pt here with right knee pain x 2 weeks. Knot is present on calf. Swelling with 10/10 pain. No injury.

## 2021-07-31 ENCOUNTER — Emergency Department (HOSPITAL_BASED_OUTPATIENT_CLINIC_OR_DEPARTMENT_OTHER)
Admission: EM | Admit: 2021-07-31 | Discharge: 2021-07-31 | Disposition: A | Discharge status: Home / Self Care | Payer: Self-pay | Attending: Emergency Medicine | Admitting: Emergency Medicine

## 2021-07-31 ENCOUNTER — Other Ambulatory Visit: Payer: Self-pay

## 2021-07-31 ENCOUNTER — Encounter (HOSPITAL_BASED_OUTPATIENT_CLINIC_OR_DEPARTMENT_OTHER): Payer: Self-pay | Admitting: *Deleted

## 2021-07-31 ENCOUNTER — Emergency Department (HOSPITAL_BASED_OUTPATIENT_CLINIC_OR_DEPARTMENT_OTHER): Payer: Self-pay

## 2021-07-31 DIAGNOSIS — R109 Unspecified abdominal pain: Secondary | ICD-10-CM | POA: Insufficient documentation

## 2021-07-31 DIAGNOSIS — R110 Nausea: Secondary | ICD-10-CM | POA: Insufficient documentation

## 2021-07-31 DIAGNOSIS — R5383 Other fatigue: Secondary | ICD-10-CM

## 2021-07-31 LAB — URINALYSIS, ROUTINE W REFLEX MICROSCOPIC
Bilirubin Urine: NEGATIVE
Glucose, UA: NEGATIVE mg/dL
Ketones, ur: NEGATIVE mg/dL
Nitrite: NEGATIVE
Protein, ur: NEGATIVE mg/dL
Specific Gravity, Urine: >1.03 — ABNORMAL HIGH (ref 1.005–1.030)
pH: 6 (ref 5.0–8.0)

## 2021-07-31 LAB — URINALYSIS, MICROSCOPIC (REFLEX)

## 2021-07-31 LAB — CBC
HCT: 38.2 % (ref 36.0–46.0)
Hemoglobin: 12 g/dL (ref 12.0–15.0)
MCH: 29.4 pg (ref 26.0–34.0)
MCHC: 31.4 g/dL (ref 30.0–36.0)
MCV: 93.6 fL (ref 80.0–100.0)
Platelets: 309 10*3/uL (ref 150–400)
RBC: 4.08 MIL/uL (ref 3.87–5.11)
RDW: 16.7 % — ABNORMAL HIGH (ref 11.5–15.5)
WBC: 7.3 10*3/uL (ref 4.0–10.5)
nRBC: 0 % (ref 0.0–0.2)

## 2021-07-31 LAB — BASIC METABOLIC PANEL
Anion gap: 8 (ref 5–15)
BUN: 13 mg/dL (ref 6–20)
CO2: 25 mmol/L (ref 22–32)
Calcium: 8.3 mg/dL — ABNORMAL LOW (ref 8.9–10.3)
Chloride: 104 mmol/L (ref 98–111)
Creatinine, Ser: 0.55 mg/dL (ref 0.44–1.00)
GFR, Estimated: >60 mL/min (ref >60)
Glucose, Bld: 76 mg/dL (ref 70–99)
Potassium: 4 mmol/L (ref 3.5–5.1)
Sodium: 137 mmol/L (ref 135–145)

## 2021-07-31 MED ORDER — KETOROLAC TROMETHAMINE 30 MG/ML IJ SOLN
30.0000 mg | Freq: Once | INTRAMUSCULAR | Status: AC
  Administered 2021-07-31: 30 mg via INTRAMUSCULAR
  Filled 2021-07-31: qty 1

## 2021-07-31 NOTE — Discharge Instructions (Addendum)
Your work-up today was reassuring, no signs of kidney stones or other abdominal pathology.  Please call the [facility] to establish care with a primary provider.  You are not anemic, but I am unable to identify the source of your fatigue here in the ED context.  When you have a primary care doctor, had them work you up for low thyroid as well as other conditions.  Continue taking all of your vitamins, keep a food diary documenting what you eat, what time, and how many calories.  Also record how much exercise you are getting.  Bring this with you when you have your first appointment with your primary care doctor.

## 2021-07-31 NOTE — ED Triage Notes (Signed)
C/o right lower back pain and flank pain , also c/o  " extreme " fatigue x 1 week  HX anemia

## 2021-07-31 NOTE — ED Provider Notes (Signed)
[facility] EMERGENCY DEPARTMENT Provider Note   CSN: [id number] Arrival date & time: 07/31/21  1350     History Chief Complaint  Patient presents with   Flank Pain    Catherine Gallegos is a 50 y.o. female.  HPI  Patient presents with right flank pain x3 days.  Pain is constant, no aggravating or alleviating factors.  There is associated nausea, but no vomiting.  She has history of a kidney stone, but denies it feels like that.  She is not having any dysuria or hematuria.  Patient also reports having fatigue since January of this year.  She is status post gastric bypass in 2007, states she is lost her appetite.  History of anemia, gets iron infusions.    Past Medical History:  Diagnosis Date   Anemia    Arthritis    Back pain, chronic    Degenerative disk disease    Depression    Kidney stone    Kidney stones    Malabsorption    Osteoporosis    Scoliosis     Patient Active Problem List   Diagnosis Date Noted   Abdominal pain 02/22/2015    Past Surgical History:  Procedure Laterality Date   ABDOMINAL HYSTERECTOMY     ABDOMINAL SURGERY     c-section   BARIATRIC SURGERY     c sectiions     CHOLECYSTECTOMY     rectal fiscure     TONSILLECTOMY     WRIST SURGERY Right      OB History   No obstetric history on file.     Family History  Problem Relation Age of Onset   Diabetes Father    Cancer Father    Osteoporosis Mother     Social History   Tobacco Use   Smoking status: Never   Smokeless tobacco: Never  Vaping Use   Vaping Use: Never used  Substance Use Topics   Alcohol use: No   Drug use: No    Home Medications Prior to Admission medications   Medication Sig Start Date End Date Taking? Authorizing Provider  Calcium Carbonate-Vitamin D (CALCIUM + D PO) Take by mouth.    [provider]  Cholecalciferol (VITAMIN D3) 3000 UNITS TABS Take by mouth.    [provider]  HYDROcodone-acetaminophen (NORCO) 5-325 MG tablet  Take 1 tablet by mouth every 4 (four) hours as needed for severe pain. 07/13/20   Pricilla Loveless, MD  methocarbamol (ROBAXIN) 500 MG tablet Take 1 tablet (500 mg total) by mouth every 8 (eight) hours as needed for muscle spasms. 04/12/18   Benjiman Core, MD    Allergies    Morphine and related, Tolmetin, Gabapentin, Nsaids, Tramadol, Clindamycin, Duloxetine, and Other  Review of Systems   Review of Systems  Constitutional:  Positive for appetite change and fatigue. Negative for fever.  Respiratory:  Negative for shortness of breath.   Cardiovascular:  Negative for chest pain.  Gastrointestinal:  Negative for anal bleeding, nausea and vomiting.  Genitourinary:  Positive for flank pain. Negative for dysuria and hematuria.  Musculoskeletal:  Positive for back pain.   Physical Exam Updated Vital Signs BP 131/74   Pulse 68   Temp 98.6 F (37 C) (Oral)   Resp 16   Ht 5\' 3"  (1.6 m)   Wt 98.4 kg   SpO2 100%   BMI 38.44 kg/m   Physical Exam Vitals and nursing note reviewed. Exam conducted with a chaperone present.  Constitutional:  Appearance: She is obese.     Comments: Patient is resting comfortably, no acute distress.  HENT:     Head: Normocephalic and atraumatic.  Eyes:     General: No scleral icterus.       Right eye: No discharge.        Left eye: No discharge.     Extraocular Movements: Extraocular movements intact.     Pupils: Pupils are equal, round, and reactive to light.  Cardiovascular:     Rate and Rhythm: Normal rate and regular rhythm.     Pulses: Normal pulses.     Heart sounds: Normal heart sounds. No murmur heard.   No friction rub. No gallop.  Pulmonary:     Effort: Pulmonary effort is normal. No respiratory distress.     Breath sounds: Normal breath sounds.  Abdominal:     General: Abdomen is flat. Bowel sounds are normal. There is no distension.     Palpations: Abdomen is soft.     Tenderness: There is abdominal tenderness.     Comments: No CVA  tenderness, abdomen is soft without guarding or rigidity.  Skin:    General: Skin is warm and dry.     Coloration: Skin is not jaundiced.  Neurological:     Mental Status: She is alert. Mental status is at baseline.     Coordination: Coordination normal.    ED Results / Procedures / Treatments   Labs (all labs ordered are listed, but only abnormal results are displayed) Labs Reviewed  URINALYSIS, ROUTINE W REFLEX MICROSCOPIC - Abnormal; Notable for the following components:      Result Value   Specific Gravity, Urine >1.030 (*)    Hgb urine dipstick TRACE (*)    Leukocytes,Ua SMALL (*)    All other components within normal limits  BASIC METABOLIC PANEL - Abnormal; Notable for the following components:   Calcium 8.3 (*)    All other components within normal limits  CBC - Abnormal; Notable for the following components:   RDW 16.7 (*)    All other components within normal limits  URINALYSIS, MICROSCOPIC (REFLEX) - Abnormal; Notable for the following components:   Bacteria, UA FEW (*)    All other components within normal limits    EKG None  Radiology CT Renal Stone Study  Result Date: 07/31/2021 CLINICAL DATA:  Right lower back and flank pain with extreme fatigue x1 week. History of renal stones. EXAM: CT ABDOMEN AND PELVIS WITHOUT CONTRAST TECHNIQUE: Multidetector CT imaging of the abdomen and pelvis was performed following the standard protocol without IV contrast. COMPARISON:  February 26, 2020 FINDINGS: Lower chest: No acute abnormality.  Small hiatal hernia. Hepatobiliary: Unremarkable noncontrast appearance of the hepatic parenchyma. Gallbladder surgically absent. No biliary ductal dilation. Pancreas: Within normal limits. Spleen: Within normal limits. Adrenals/Urinary Tract: Adrenal glands are unremarkable. Kidneys are normal, without renal calculi, contour deforming lesion, or hydronephrosis. Bladder is predominantly decompressed. Stomach/Bowel: Gastric bypass anatomy. No  pathologic dilation of small bowel. The appendix is not confidently identified however there is no pericecal inflammation. Terminal ileum appears normal. No suspicious colonic wall thickening or mass like lesions visualized. Vascular/Lymphatic: No abdominal aortic aneurysm. Similar size of the upper abdominal retroperitoneal lymph node measuring 9 mm on image 22/2 previously 10 mm, stable dating back to 10/29/2018 and consistent with a benign etiology. No pathologically enlarged abdominal or pelvic lymph nodes visualized. Reproductive: Status post hysterectomy. No adnexal masses. Other: No abdominopelvic ascites. Musculoskeletal: No acute or significant osseous findings.  IMPRESSION: 1. No acute findings in the abdomen or pelvis. Specifically, no evidence of obstructive uropathy. 2. Gastric bypass anatomy without evidence of bowel obstruction. 3. Small hiatal hernia. Electronically Signed   By: Maudry Mayhew MD   On: 07/31/2021 15:53    Procedures Procedures   Medications Ordered in ED Medications - No data to display  ED Course  I have reviewed the triage vital signs and the nursing notes.  Pertinent labs & imaging results that were available during my care of the patient were reviewed by me and considered in my medical decision making (see chart for details).  Clinical Course as of 07/31/21 1948  Thu Jul 31, 2021  1702 Basic metabolic panel(!) No electrolyte derangement, no AKI. [HS]  1702 Urinalysis, Routine w reflex microscopic Urine, Clean Catch(!) No UTI [HS]  1702 CBC(!) No leukocytosis, no anemia [HS]  1703 CT Renal Stone Study No obstructive urinary pathology, no acute pathology. [HS]    Clinical Course User Index [HS] Theron Arista, PA-C   MDM Rules/Calculators/A&P                           Patient is stable vital signs, she is nontoxic.  Differential includes UTI, nephrolithiasis, pancreatitis, nutritional deficiencies, anemia, appendicitis, gastritis.  Physical exam is  unremarkable, there is some mild tenderness over the right flank but no CVA tenderness.  No peritoneal signs, low suspicion for surgical abdomen.  Please see ED course for dictation of labs and imaging as they pertain to the differential.  CT and lab work-up is reassuring, no signs of emergent pathology at this time.  I suspect the pain is likely musculoskeletal.  She has been fatigued since January, do not think this is an emergent condition.  Nutritional deficiencies since the gastric bypass is also a possibility, I will give her information on diet.  We will also have her keep track of what food she eats as well as calories.  We will have her establish care with a primary care doctor for further work-up including thyroid.    Return precaution discussed with the patient who verbalized understanding and agreement with the plan.  Final Clinical Impression(s) / ED Diagnoses Final diagnoses:  None    Rx / DC Orders ED Discharge Orders     None        Theron Arista, PA-C 07/31/21 1949    Virgina Norfolk, DO 07/31/21 1959

## 2021-09-16 ENCOUNTER — Ambulatory Visit: Payer: Self-pay | Admitting: Family Medicine

## 2022-07-31 ENCOUNTER — Emergency Department (HOSPITAL_COMMUNITY): Payer: Commercial Managed Care - HMO

## 2022-07-31 ENCOUNTER — Emergency Department (HOSPITAL_BASED_OUTPATIENT_CLINIC_OR_DEPARTMENT_OTHER)
Admission: EM | Admit: 2022-07-31 | Discharge: 2022-08-01 | Disposition: A | Discharge status: Home / Self Care | Payer: Commercial Managed Care - HMO | Attending: Emergency Medicine | Admitting: Emergency Medicine

## 2022-07-31 ENCOUNTER — Other Ambulatory Visit: Payer: Self-pay

## 2022-07-31 ENCOUNTER — Emergency Department (HOSPITAL_BASED_OUTPATIENT_CLINIC_OR_DEPARTMENT_OTHER): Payer: Commercial Managed Care - HMO

## 2022-07-31 ENCOUNTER — Encounter (HOSPITAL_BASED_OUTPATIENT_CLINIC_OR_DEPARTMENT_OTHER): Payer: Self-pay | Admitting: *Deleted

## 2022-07-31 DIAGNOSIS — R251 Tremor, unspecified: Secondary | ICD-10-CM | POA: Diagnosis not present

## 2022-07-31 DIAGNOSIS — R339 Retention of urine, unspecified: Secondary | ICD-10-CM | POA: Insufficient documentation

## 2022-07-31 DIAGNOSIS — R000 Tachycardia, unspecified: Secondary | ICD-10-CM | POA: Diagnosis not present

## 2022-07-31 DIAGNOSIS — R4781 Slurred speech: Secondary | ICD-10-CM | POA: Insufficient documentation

## 2022-07-31 LAB — CBC WITH DIFFERENTIAL/PLATELET
Abs Immature Granulocytes: 0.01 10*3/uL (ref 0.00–0.07)
Basophils Absolute: 0.1 10*3/uL (ref 0.0–0.1)
Basophils Relative: 1 %
Eosinophils Absolute: 0.3 10*3/uL (ref 0.0–0.5)
Eosinophils Relative: 6 %
HCT: 33.3 % — ABNORMAL LOW (ref 36.0–46.0)
Hemoglobin: 9.7 g/dL — ABNORMAL LOW (ref 12.0–15.0)
Immature Granulocytes: 0 %
Lymphocytes Relative: 41 %
Lymphs Abs: 2.3 10*3/uL (ref 0.7–4.0)
MCH: 20.9 pg — ABNORMAL LOW (ref 26.0–34.0)
MCHC: 29.1 g/dL — ABNORMAL LOW (ref 30.0–36.0)
MCV: 71.6 fL — ABNORMAL LOW (ref 80.0–100.0)
Monocytes Absolute: 0.5 10*3/uL (ref 0.1–1.0)
Monocytes Relative: 9 %
Neutro Abs: 2.4 10*3/uL (ref 1.7–7.7)
Neutrophils Relative %: 43 %
Platelets: 456 10*3/uL — ABNORMAL HIGH (ref 150–400)
RBC: 4.65 MIL/uL (ref 3.87–5.11)
RDW: 15.9 % — ABNORMAL HIGH (ref 11.5–15.5)
WBC: 5.6 10*3/uL (ref 4.0–10.5)
nRBC: 0 % (ref 0.0–0.2)

## 2022-07-31 LAB — COMPREHENSIVE METABOLIC PANEL
ALT: 32 U/L (ref 0–44)
AST: 41 U/L (ref 15–41)
Albumin: 3.4 g/dL — ABNORMAL LOW (ref 3.5–5.0)
Alkaline Phosphatase: 130 U/L — ABNORMAL HIGH (ref 38–126)
Anion gap: 8 (ref 5–15)
BUN: 9 mg/dL (ref 6–20)
CO2: 22 mmol/L (ref 22–32)
Calcium: 8.3 mg/dL — ABNORMAL LOW (ref 8.9–10.3)
Chloride: 106 mmol/L (ref 98–111)
Creatinine, Ser: 0.55 mg/dL (ref 0.44–1.00)
GFR, Estimated: >60 mL/min (ref >60)
Glucose, Bld: 88 mg/dL (ref 70–99)
Potassium: 3.9 mmol/L (ref 3.5–5.1)
Sodium: 136 mmol/L (ref 135–145)
Total Bilirubin: 0.3 mg/dL (ref 0.3–1.2)
Total Protein: 7.4 g/dL (ref 6.5–8.1)

## 2022-07-31 LAB — CBG MONITORING, ED: Glucose-Capillary: 91 mg/dL (ref 70–99)

## 2022-07-31 MED ORDER — METHOCARBAMOL 500 MG PO TABS
500.0000 mg | ORAL_TABLET | Freq: Once | ORAL | Status: AC
  Administered 2022-07-31: 500 mg via ORAL
  Filled 2022-07-31: qty 1

## 2022-07-31 MED ORDER — ACETAMINOPHEN 500 MG PO TABS
1000.0000 mg | ORAL_TABLET | Freq: Once | ORAL | Status: AC
Start: 2022-07-31 — End: 2022-07-31
  Administered 2022-07-31: 1000 mg via ORAL
  Filled 2022-07-31: qty 2

## 2022-07-31 MED ORDER — SODIUM CHLORIDE 0.9 % IV BOLUS
1000.0000 mL | Freq: Once | INTRAVENOUS | Status: AC
  Administered 2022-07-31: 1000 mL via INTRAVENOUS

## 2022-07-31 MED ORDER — LORAZEPAM 2 MG/ML IJ SOLN: 1.0000 mg | INTRAMUSCULAR | Status: DC | PRN

## 2022-07-31 MED ORDER — LORAZEPAM 2 MG/ML IJ SOLN
1.0000 mg | Freq: Once | INTRAMUSCULAR | Status: AC
  Administered 2022-07-31: 1 mg via INTRAVENOUS
  Filled 2022-07-31: qty 1

## 2022-07-31 MED ORDER — SODIUM CHLORIDE 0.9 % IV BOLUS: 2000.0000 mL | Freq: Once | INTRAVENOUS | Status: DC

## 2022-07-31 MED ORDER — LORAZEPAM 2 MG/ML IJ SOLN
2.0000 mg | Freq: Once | INTRAMUSCULAR | Status: AC
  Administered 2022-07-31: 2 mg via INTRAVENOUS
  Filled 2022-07-31: qty 1

## 2022-07-31 NOTE — ED Notes (Signed)
Asleep in NAD, no tremor at this time

## 2022-07-31 NOTE — ED Notes (Signed)
Provider at bedside updating patient and family on POC, to be transferred to [facility] for MRI

## 2022-07-31 NOTE — ED Notes (Signed)
Pt assisted onto bedpan. Able to urinate however bedpan was out of position and went onto bed. Unable to collect sample, full linen change complete.

## 2022-07-31 NOTE — ED Notes (Signed)
Awoke at 0100hrs and did not feel well, speech was noted to be slurred. Last night took a delta 8 gummy at 10pm, shaking all over. Has not be able to void. Went to [facility] ED and was DC to home. Went home after rec 2mg  Ativan, awoke a little bit ago and now having same symptoms

## 2022-07-31 NOTE — ED Notes (Signed)
Report given to [facility] with [facility]. En route to get patient

## 2022-07-31 NOTE — ED Notes (Signed)
Report given to Jolinda Croak RN @ [facility] ED

## 2022-07-31 NOTE — ED Provider Notes (Signed)
[facility] EMERGENCY DEPARTMENT Provider Note   CSN: [id number] Arrival date & time: 07/31/22  1456     History  Chief Complaint  Patient presents with   Tremors    Catherine Gallegos is a 51 y.o. female PMH chronic back pain, degenerative disc disease who is presenting with tremor, slurred speech, and urinary retention.  Fianc at bedside, contributed to the history.  He reports that yesterday evening/earlier this morning, the patient took a delta 8 gummy and went to sleep.  Shortly thereafter, she woke him up saying something was not right.  Since then, she has been experiencing right upper extremity and right lower extremity tremors.  Catherine Gallegos says that the patient will occasionally have episodes of right arm shaking.  However, patient is insistent that something is wrong.  She also notes inability to urinate on her own since last night.  She says that she feels as if she has to void, however is unable to urinate.  Patient notes mild headache.  Denies any chest pain, difficulty breathing, abdominal pain, nausea, vomiting, saddle anesthesia, weakness of extremities.   Home Medications Prior to Admission medications   Medication Sig Start Date End Date Taking? Authorizing Provider  Calcium Carbonate-Vitamin D (CALCIUM + D PO) Take by mouth.    [provider]  Cholecalciferol (VITAMIN D3) 3000 UNITS TABS Take by mouth.    [provider]  HYDROcodone-acetaminophen (NORCO) 5-325 MG tablet Take 1 tablet by mouth every 4 (four) hours as needed for severe pain. 07/13/20   Pricilla Loveless, MD  methocarbamol (ROBAXIN) 500 MG tablet Take 1 tablet (500 mg total) by mouth every 8 (eight) hours as needed for muscle spasms. 04/12/18   Benjiman Core, MD      Allergies    Morphine and related, Tolmetin, Gabapentin, Nsaids, Tramadol, Clindamycin, Duloxetine, and Other    Review of Systems   Review of Systems As in HPI.  Physical Exam Updated Vital Signs BP  136/83   Pulse 81   Temp 98.7 F (37.1 C) (Oral)   Resp 18   Ht 5\' 3"  (1.6 m)   Wt 99.4 kg   SpO2 100%   BMI 38.82 kg/m  Physical Exam Vitals and nursing note reviewed.  Constitutional:      General: She is not in acute distress.    Appearance: She is well-developed. She is obese. She is not ill-appearing or diaphoretic.  HENT:     Head: Normocephalic and atraumatic.     Right Ear: External ear normal.     Left Ear: External ear normal.     Nose: Nose normal.     Mouth/Throat:     Mouth: Mucous membranes are moist.     Pharynx: Oropharynx is clear.  Eyes:     Pupils: Pupils are equal, round, and reactive to light.  Cardiovascular:     Rate and Rhythm: Normal rate and regular rhythm.     Heart sounds: No murmur heard. Pulmonary:     Effort: Pulmonary effort is normal. No respiratory distress.     Breath sounds: Normal breath sounds.  Abdominal:     General: There is no distension.     Palpations: Abdomen is soft.     Tenderness: There is no abdominal tenderness. There is no guarding.  Musculoskeletal:     Cervical back: Neck supple.     Right lower leg: No edema.     Left lower leg: No edema.  Skin:    General: Skin  is warm and dry.  Neurological:     Mental Status: She is alert.     Comments: Patient tearful, intermittently cooperating with exam due to "being tired".  Patient follows commands.  Patient with symmetric strength of bilateral upper and lower extremities, though equally reduced effort.  Resting tremor of right upper and lower extremity, which she is able to overcome with intentional movement/effort.     ED Results / Procedures / Treatments   Labs (all labs ordered are listed, but only abnormal results are displayed) Labs Reviewed  CBC WITH DIFFERENTIAL/PLATELET - Abnormal; Notable for the following components:      Result Value   Hemoglobin 9.7 (*)    HCT 33.3 (*)    MCV 71.6 (*)    MCH 20.9 (*)    MCHC 29.1 (*)    RDW 15.9 (*)    Platelets 456  (*)    All other components within normal limits  COMPREHENSIVE METABOLIC PANEL - Abnormal; Notable for the following components:   Calcium 8.3 (*)    Albumin 3.4 (*)    Alkaline Phosphatase 130 (*)    All other components within normal limits  URINALYSIS, ROUTINE W REFLEX MICROSCOPIC  RAPID URINE DRUG SCREEN, HOSP PERFORMED  CBG MONITORING, ED  CBG MONITORING, ED    EKG EKG Interpretation  Date/Time:  Friday July 31 2022 15:07:59 EDT Ventricular Rate:  106 PR Interval:  182 QRS Duration: 76 QT Interval:  323 QTC Calculation: 429 R Axis:   73 Text Interpretation: Sinus tachycardia Paired ventricular premature complexes Consider left atrial enlargement Abnormal R-wave progression, early transition No previous ECGs available Confirmed by Richardean Canal [phone]) on 07/31/2022 4:30:29 PM  Radiology CT Lumbar Spine Wo Contrast  Result Date: 07/31/2022 CLINICAL DATA:  Low back pain, cauda equina syndrome suspected EXAM: CT LUMBAR SPINE WITHOUT CONTRAST TECHNIQUE: Multidetector CT imaging of the lumbar spine was performed without intravenous contrast administration. Multiplanar CT image reconstructions were also generated. RADIATION DOSE REDUCTION: This exam was performed according to the departmental dose-optimization program which includes automated exposure control, adjustment of the mA and/or kV according to patient size and/or use of iterative reconstruction technique. COMPARISON:  No prior lumbar spine CT 05/24/2018 MRI lumbar spine, 07/31/2021 CT renal stone protocol FINDINGS: Segmentation: 5 lumbar type vertebrae. Alignment: Normal. Vertebrae: No acute fracture or suspicious osseous lesion. Chronic appearing right L1 transverse process fracture appears unchanged compared to 11/09/2021. Paraspinal and other soft tissues: Punctate nonobstructing right nephrolithiasis. Disc levels: No significant disc height loss, facet arthropathy spinal canal stenosis or neural foraminal narrowing.  IMPRESSION: No spinal canal stenosis or neural foraminal narrowing. No evidence of cauda equina syndrome. Electronically Signed   By: Wiliam Ke M.D.   On: 07/31/2022 16:21   CT Head Wo Contrast  Result Date: 07/31/2022 CLINICAL DATA:  Headache. EXAM: CT HEAD WITHOUT CONTRAST TECHNIQUE: Contiguous axial images were obtained from the base of the skull through the vertex without intravenous contrast. RADIATION DOSE REDUCTION: This exam was performed according to the departmental dose-optimization program which includes automated exposure control, adjustment of the mA and/or kV according to patient size and/or use of iterative reconstruction technique. COMPARISON:  October 05, 2017. FINDINGS: Brain: No evidence of acute infarction, hemorrhage, hydrocephalus, extra-axial collection or mass lesion/mass effect. Vascular: No hyperdense vessel or unexpected calcification. Skull: Normal. Negative for fracture or focal lesion. Sinuses/Orbits: No acute finding. Other: None. IMPRESSION: No acute intracranial abnormality seen. Electronically Signed   By: Zenda Alpers.D.  On: 07/31/2022 15:59    Procedures Procedures    Medications Ordered in ED Medications  LORazepam (ATIVAN) injection 1 mg (has no administration in time range)  LORazepam (ATIVAN) injection 2 mg (2 mg Intravenous Given 07/31/22 1626)  sodium chloride 0.9 % bolus 1,000 mL (0 mLs Intravenous Stopped 07/31/22 1729)    ED Course/ Medical Decision Making/ A&P Clinical Course as of 07/31/22 2047  Fri Jul 31, 2022  1603 Bladder scan 189 [HS]  1640 I reevaluated the patient. She states she is unable to urinate.  States the slurring of her voice is getting worse instead of better.  No improvement of her symptoms with the Ativan. [HS]    Clinical Course User Index [HS] Theron Arista, PA-C    Medical Decision Making Amount and/or Complexity of Data Reviewed Labs: ordered. Radiology: ordered. ECG/medicine tests: ordered.  Risk OTC  drugs. Prescription drug management.   Catherine Gallegos is a 51 y.o. female with significant PMHx of chronic back pain who presented to the ED as transfer from [facility] emergency department for MRI lumbar spine and brain in the setting of tremors, urinary retention, and slurred speech.  Vitals at presentation within normal limits.  Patient is hemodynamically stable, afebrile, satting well on room air.  Physical exam notable as above for resting tremor of right upper and lower extremity, which disappears with intentional movement.  Neurologic exam limited due to lack of patient participation, the patient does follow commands at times, and is able to move all extremities against gravity.  Normal heart sounds.  Lungs clear to auscultation bilaterally.  Abdomen is soft, nontender to palpation.  No pitting edema.  Patient was seen at [facility] emergency department where she underwent CT head and CT lumbar spine, which did not show acute abnormalities per radiology read.  Neurology was consulted, and recommended additional imaging with MRI.  Patient was sent here for additional imaging of MRI brain and MRI L-spine, which has been ordered.  Reads are pending at the time of signout.  Initial differential includes but is not limited to: Essential tremor, functional tremor, brain lesion, spinal cord lesion, cauda equina syndrome  Lab work obtained at [facility] was reviewed by me, notable for CMP with electrolytes within normal limits.  No anion gap.  No elevated LFTs.  No evidence of AKI.  CBC without leukocytosis.  Hemoglobin 9.7, no recent lab work available for comparison.   Interventions include IV ativan, PO robaxin, PO tylenol.  Patient's ultimate disposition will depend on results of MRI.  If negative, patient likely suitable for discharge home with outpatient neurology follow-up.  The plan for this patient was discussed with Dr. Rosalia Hammers, who voiced agreement and who oversaw  evaluation and treatment of this patient.    Final Clinical Impression(s) / ED Diagnoses Final diagnoses:  Slurred speech    Rx / DC Orders ED Discharge Orders     None         Skeet Simmer, MD 08/01/22 [place] Bruns    Margarita Grizzle, MD 08/01/22 [phone]

## 2022-07-31 NOTE — ED Notes (Signed)
Per provider do not I/O cath. Bladder scan showed 

## 2022-07-31 NOTE — ED Provider Notes (Addendum)
[facility] Provider Note   CSN: [id number] Arrival date & time: 07/31/22  1456     History  Chief Complaint  Patient presents with   Tremors    Catherine Gallegos is a 51 y.o. female.  HPI   Patient with medical history of chronic back pain, degenerative disc disease, history of bariatric surgery presents today due to tremor and urinary retention.  Patient states yesterday evening/early this a.m. patient took a delta 8 gummy roughly 1 AM she started having slurred speech, tremoring on upper and lower extremities diffusely.  Seen at [facility], given 2 mg of Ativan and responded positively. LE Tremors resolved, RUE tremor persistent.  Patient tells me she has been unable to void since last night, she has chronic back pain denies any new injuries or traumas to the back.  She states she was cathed at [facility] and sent home. Has not urinated since yesterday. She took a nap but woke up and is still having symptoms prompting ED visit.   Patient follows commands, she denies any other illicit drug use (besides delta8).  Denies any history of strokes or spinal surgeries.  Denies any saddle anesthesia, abdominal pain, chest pain, shortness of breath, hemoptysis.    Patient's fianc is at bedside providing additional history.  Home Medications Prior to Admission medications   Medication Sig Start Date End Date Taking? Authorizing Provider  Calcium Carbonate-Vitamin D (CALCIUM + D PO) Take by mouth.    [provider]  Cholecalciferol (VITAMIN D3) 3000 UNITS TABS Take by mouth.    [provider]  HYDROcodone-acetaminophen (NORCO) 5-325 MG tablet Take 1 tablet by mouth every 4 (four) hours as needed for severe pain. 07/13/20   Sherwood Gambler, MD  methocarbamol (ROBAXIN) 500 MG tablet Take 1 tablet (500 mg total) by mouth every 8 (eight) hours as needed for muscle spasms. 04/12/18   Davonna Belling, MD      Allergies     Morphine and related, Tolmetin, Gabapentin, Nsaids, Tramadol, Clindamycin, Duloxetine, and Other    Review of Systems   Review of Systems  Neurological:  Positive for tremors.    Physical Exam Updated Vital Signs BP 109/69   Pulse 78   Temp 98.7 F (37.1 C) (Oral)   Resp 15   Wt 99.4 kg   SpO2 93%   BMI 38.82 kg/m  Physical Exam Vitals and nursing note reviewed. Exam conducted with a chaperone present.  Constitutional:      Appearance: Normal appearance.  HENT:     Head: Normocephalic and atraumatic.  Eyes:     General: No scleral icterus.       Right eye: No discharge.        Left eye: No discharge.     Extraocular Movements: Extraocular movements intact.     Pupils: Pupils are equal, round, and reactive to light.  Cardiovascular:     Rate and Rhythm: Regular rhythm. Tachycardia present.     Pulses: Normal pulses.     Heart sounds: Normal heart sounds. No murmur heard.    No friction rub. No gallop.  Pulmonary:     Effort: Pulmonary effort is normal. No respiratory distress.     Breath sounds: Normal breath sounds.  Abdominal:     General: Abdomen is flat. Bowel sounds are normal. There is no distension.     Palpations: Abdomen is soft.     Tenderness: There is no abdominal tenderness.  Genitourinary:  Comments: Patient has rectal tone.  Chaperone in room during rectal exam. Skin:    General: Skin is warm and dry.     Coloration: Skin is not jaundiced.  Neurological:     Mental Status: She is alert. Mental status is at baseline.     Coordination: Coordination normal.     Comments: Cranial nerves II through XII are grossly intact.  Grip strength is symmetric bilaterally, lower extremity strength is symmetric although reduced diffusely secondary to effort.  No pronator drift, mild slurred speech but no droop. Patient does have resting and intentional tremor to right upper extremity  Psychiatric:     Comments: Anxious affect     ED Results / Procedures /  Treatments   Labs (all labs ordered are listed, but only abnormal results are displayed) Labs Reviewed  CBC WITH DIFFERENTIAL/PLATELET - Abnormal; Notable for the following components:      Result Value   Hemoglobin 9.7 (*)    HCT 33.3 (*)    MCV 71.6 (*)    MCH 20.9 (*)    MCHC 29.1 (*)    RDW 15.9 (*)    Platelets 456 (*)    All other components within normal limits  COMPREHENSIVE METABOLIC PANEL - Abnormal; Notable for the following components:   Calcium 8.3 (*)    Albumin 3.4 (*)    Alkaline Phosphatase 130 (*)    All other components within normal limits  RAPID URINE DRUG SCREEN, HOSP PERFORMED  URINALYSIS, ROUTINE W REFLEX MICROSCOPIC  CBG MONITORING, ED  CBG MONITORING, ED    EKG EKG Interpretation  Date/Time:  Friday July 31 2022 15:07:59 EDT Ventricular Rate:  106 PR Interval:  182 QRS Duration: 76 QT Interval:  323 QTC Calculation: 429 R Axis:   73 Text Interpretation: Sinus tachycardia Paired ventricular premature complexes Consider left atrial enlargement Abnormal R-wave progression, early transition No previous ECGs available Confirmed by Wandra Arthurs [phone]) on 07/31/2022 4:30:29 PM  Radiology CT Lumbar Spine Wo Contrast  Result Date: 07/31/2022 CLINICAL DATA:  Low back pain, cauda equina syndrome suspected EXAM: CT LUMBAR SPINE WITHOUT CONTRAST TECHNIQUE: Multidetector CT imaging of the lumbar spine was performed without intravenous contrast administration. Multiplanar CT image reconstructions were also generated. RADIATION DOSE REDUCTION: This exam was performed according to the departmental dose-optimization program which includes automated exposure control, adjustment of the mA and/or kV according to patient size and/or use of iterative reconstruction technique. COMPARISON:  No prior lumbar spine CT 05/24/2018 MRI lumbar spine, 07/31/2021 CT renal stone protocol FINDINGS: Segmentation: 5 lumbar type vertebrae. Alignment: Normal. Vertebrae: No acute fracture  or suspicious osseous lesion. Chronic appearing right L1 transverse process fracture appears unchanged compared to 11/09/2021. Paraspinal and other soft tissues: Punctate nonobstructing right nephrolithiasis. Disc levels: No significant disc height loss, facet arthropathy spinal canal stenosis or neural foraminal narrowing. IMPRESSION: No spinal canal stenosis or neural foraminal narrowing. No evidence of cauda equina syndrome. Electronically Signed   By: Merilyn Baba M.D.   On: 07/31/2022 16:21   CT Head Wo Contrast  Result Date: 07/31/2022 CLINICAL DATA:  Headache. EXAM: CT HEAD WITHOUT CONTRAST TECHNIQUE: Contiguous axial images were obtained from the base of the skull through the vertex without intravenous contrast. RADIATION DOSE REDUCTION: This exam was performed according to the departmental dose-optimization program which includes automated exposure control, adjustment of the mA and/or kV according to patient size and/or use of iterative reconstruction technique. COMPARISON:  October 05, 2017. FINDINGS: Brain: No evidence of acute  infarction, hemorrhage, hydrocephalus, extra-axial collection or mass lesion/mass effect. Vascular: No hyperdense vessel or unexpected calcification. Skull: Normal. Negative for fracture or focal lesion. Sinuses/Orbits: No acute finding. Other: None. IMPRESSION: No acute intracranial abnormality seen. Electronically Signed   By: Marijo Conception M.D.   On: 07/31/2022 15:59    Procedures Procedures    Medications Ordered in ED Medications  LORazepam (ATIVAN) injection 1 mg (has no administration in time range)  LORazepam (ATIVAN) injection 2 mg (2 mg Intravenous Given 07/31/22 1626)  sodium chloride 0.9 % bolus 1,000 mL (1,000 mLs Intravenous New Bag/Given 07/31/22 1626)    ED Course/ Medical Decision Making/ A&P Clinical Course as of 07/31/22 1742  Fri Jul 31, 2022  1603 Bladder scan 189 [HS]  1640 I reevaluated the patient. She states she is unable to urinate.   States the slurring of her voice is getting worse instead of better.  No improvement of her symptoms with the Ativan. [HS]    Clinical Course User Index [HS] Sherrill Raring, PA-C                           Medical Decision Making Amount and/or Complexity of Data Reviewed Labs: ordered. Radiology: ordered. ECG/medicine tests: ordered.  Risk Prescription drug management.   Patient presents due to tremor and urinary retention.  Differential includes but certainly is not limited to drug reaction, stroke, TIA, seizure, cauda equina, AKI.  Patient's fianc is at bedside providing independent history.  Also reviewed external records. Patient was seen earlier this morning at [facility] and given 2 mg of Ativan, she was documented to be ambulatory without difficulty at that time and observed for 3 and half hours.  Improved with 2 mg of Ativan.  I ordered, viewed and interpreted laboratory work-up.  CBC is without leukocytosis, stable anemia with a hemoglobin of 9.7.  CMP is without gross electrolyte derangement or AKI.  Alk phos is mildly elevated at 130.  Patient's glucose is within normal limits at 91.  UA and UDS not obtained..  I ordered 2 mg of Ativan and fluid liter bolus.  I reviewed imaging CT head and CT lumbar spine.  CT head is negative, CT lumbar is negative.    Rectal tone present on exam.  I considered cauda equina but feel less likely. Additionally the slurred speech is actually worsening after the Ativan and the right upper extremity tremor has been persistent.  I spoke with Dr. Leonel Ramsay with neurology.  Given patient is having persistent slurred speech she will need MRI brain without, concerned that we do not know what some delta 8 that could be causing a stroke.  Patient will go ED to ED to [facility] for MRI brain without.  If negative patient will be appropriate for outpatient follow-up.  If positive patient will need admission for TIA/stroke work-up and  reconsult with neurology.  I also will MRI lumbar spine to evaluate for cauda equina although I do feel less likely in this current situation she is still having persistent urinary retention so we will proceed with imaging.  Dr. Tomi Bamberger at [facility] emergency department accepts patient.  Discussed HPI, physical exam and plan of care for this patient with attending Dr. Shirlyn Goltz. The attending physician evaluated this patient as part of a shared visit and agrees with plan of care.         Final Clinical Impression(s) / ED Diagnoses Final diagnoses:  None  Rx / DC Orders ED Discharge Orders     None         Sherrill Raring, PA-C 07/31/22 1715    Sherrill Raring, PA-C 07/31/22 1742    Drenda Freeze, MD 07/31/22 Joen Laura

## 2022-07-31 NOTE — ED Notes (Signed)
Upon arrival pt is shaking the R hand uncontrollably and endorses difficulty speaking.

## 2022-07-31 NOTE — ED Notes (Signed)
[facility] has arrived. Patient remains stable in NAD

## 2022-07-31 NOTE — ED Notes (Signed)
Patient transported to MRI 

## 2022-08-01 NOTE — ED Notes (Signed)
Pt crying stating "why can't you figure out what is wrong here? This is not normal". Education provided on importance of following up with neurology. Opportunity for questioning and answers were provided. Pt discharged from ED. Pt taken to ED entrance via wheel chair and assisted into vehicle.

## 2022-08-01 NOTE — Discharge Instructions (Addendum)
There is an anemia on the blood work.  The MRIs were reassuring.  Follow-up with neurology and your doctor.

## 2022-08-06 ENCOUNTER — Ambulatory Visit: Payer: Commercial Managed Care - HMO | Admitting: Diagnostic Neuroimaging

## 2022-08-06 ENCOUNTER — Encounter: Payer: Self-pay | Admitting: Diagnostic Neuroimaging

## 2022-08-06 VITALS — BP 132/74 | Ht 63.0 in | Wt 217.0 lb

## 2022-08-06 DIAGNOSIS — R4689 Other symptoms and signs involving appearance and behavior: Secondary | ICD-10-CM

## 2022-08-06 NOTE — Progress Notes (Signed)
[facility]  PATIENT: Catherine Gallegos DOB: 27-Dec-1970  REFERRING CLINICIAN: Benjiman Core, MD HISTORY FROM: patient  REASON FOR VISIT: new consult    HISTORICAL  CHIEF COMPLAINT:  Chief Complaint  Patient presents with   New Patient (Initial Visit)    Pt is stable. She states that Saturday 08/01/22, she woke up shaking with slurred speech to her husband. She states her whole left side was shaking only.  Room 7 with husband.     HISTORY OF PRESENT ILLNESS:   51 year old female here for evaluation of abnormal spell.  07/30/2022 patient took a small piece of a delta 8 gummy for the first time before she went to sleep.  She woke up at 1 in the morning on 07/31/2022 with a distant dreamlike state, faraway disconnected feeling and slurred speech.  This progressed into shaking in her entire body, mainly on the right arm and right leg.  Due to the symptoms patient was in the hospital for evaluation.  [facility].  She had some lab testing, and small dose of Ativan after which tremors dramatically improved.  Possible medication side effect was raised as etiology of symptoms.  She was discharged home.  Later in the day patient continued to have symptoms of slurred speech, not feeling well, tremors.  She went to [facility] emergency room for evaluation.  CT and MRI of the brain were completed.  Other lab test completed.  No other etiology was found.  Patient was discharged home.  Symptoms continue to fluctuate over the next 1 to 2 days.  Eventually symptoms resolved.  Patient has family history of essential tremor in mother.  Has family history of strokes in maternal grandmother.  No recurrence of symptoms which led to ER evaluations.  She has been under extremely high stress since 30-May-2023 after her father passed away.  Also has had some other stress factors.  She has never taken delta 8 gummy before.  No prior seizures.    REVIEW OF  SYSTEMS: Full 14 system review of systems performed and negative with exception of: as per HPI.  ALLERGIES: Allergies  Allergen Reactions   Morphine And Related Anaphylaxis   Tolmetin Other (See Comments)    Hx of bariatric surg   Gabapentin    Nsaids     Can't take PO NSAIDS due to Bariatric surgery.   Tramadol    Clindamycin Rash   Duloxetine Anxiety   Other Rash and Hives    Steri strips-RASH    HOME MEDICATIONS: Outpatient Medications Prior to Visit  Medication Sig Dispense Refill   Calcium Carbonate-Vitamin D (CALCIUM + D PO) Take by mouth.     Cholecalciferol (VITAMIN D3) 3000 UNITS TABS Take by mouth.     HYDROcodone-acetaminophen (NORCO) 5-325 MG tablet Take 1 tablet by mouth every 4 (four) hours as needed for severe pain. 10 tablet 0   methocarbamol (ROBAXIN) 500 MG tablet Take 1 tablet (500 mg total) by mouth every 8 (eight) hours as needed for muscle spasms. 10 tablet 0   No facility-administered medications prior to visit.    PAST MEDICAL HISTORY: Past Medical History:  Diagnosis Date   Anemia    Arthritis    Back pain, chronic    Degenerative disk disease    Depression    Kidney stone    Kidney stones    Malabsorption    Osteoporosis    Scoliosis     PAST SURGICAL HISTORY:  Past Surgical History:  Procedure Laterality Date   ABDOMINAL HYSTERECTOMY     ABDOMINAL SURGERY     c-section   BARIATRIC SURGERY     c sectiions     CHOLECYSTECTOMY     rectal fiscure     TONSILLECTOMY     WRIST SURGERY Right     FAMILY HISTORY: Family History  Problem Relation Age of Onset   Diabetes Father    Cancer Father    Osteoporosis Mother     SOCIAL HISTORY: Social History   Socioeconomic History   Marital status: Single    Spouse name: Not on file   Number of children: Not on file   Years of education: Not on file   Highest education level: Not on file  Occupational History   Not on file  Tobacco Use   Smoking status: Never   Smokeless tobacco:  Never  Vaping Use   Vaping Use: Never used  Substance and Sexual Activity   Alcohol use: No   Drug use: No   Sexual activity: Not on file  Other Topics Concern   Not on file  Social History Narrative   Not on file   Social Determinants of Health   Financial Resource Strain: Not on file  Food Insecurity: Not on file  Transportation Needs: Not on file  Physical Activity: Not on file  Stress: Not on file  Social Connections: Not on file  Intimate Partner Violence: Not on file     PHYSICAL EXAM  GENERAL EXAM/CONSTITUTIONAL: Vitals:  Vitals:   08/06/22 1321  BP: 132/74  Weight: 217 lb (98.4 kg)  Height: 5\' 3"  (1.6 m)   Body mass index is 38.44 kg/m. Wt Readings from Last 3 Encounters:  08/06/22 217 lb (98.4 kg)  07/31/22 219 lb 2.2 oz (99.4 kg)  07/31/21 217 lb (98.4 kg)   Patient is in no distress; well developed, nourished and groomed; neck is supple  CARDIOVASCULAR: Examination of carotid arteries is normal; no carotid bruits Regular rate and rhythm, no murmurs Examination of peripheral vascular system by observation and palpation is normal  EYES: Ophthalmoscopic exam of optic discs and posterior segments is normal; no papilledema or hemorrhages No results found.  MUSCULOSKELETAL: Gait, strength, tone, movements noted in Neurologic exam below  NEUROLOGIC: MENTAL STATUS:      No data to display         awake, alert, oriented to person, place and time recent and remote memory intact normal attention and concentration language fluent, comprehension intact, naming intact fund of knowledge appropriate  CRANIAL NERVE:  2nd - no papilledema on fundoscopic exam 2nd, 3rd, 4th, 6th - pupils equal and reactive to light, visual fields full to confrontation, extraocular muscles intact, no nystagmus 5th - facial sensation symmetric 7th - facial strength symmetric 8th - hearing intact 9th - palate elevates symmetrically, uvula midline 11th - shoulder shrug  symmetric 12th - tongue protrusion midline  MOTOR:  MILD POSTURAL / ACTION TREMOR IN BUE normal bulk and tone, full strength in the BUE, BLE  SENSORY:  normal and symmetric to light touch, temperature, vibration  COORDINATION:  finger-nose-finger, fine finger movements normal  REFLEXES:  deep tendon reflexes 1+ and symmetric  GAIT/STATION:  narrow based gait     DIAGNOSTIC DATA (LABS, IMAGING, TESTING) - I reviewed patient records, labs, notes, testing and imaging myself where available.  Lab Results  Component Value Date   WBC 5.6 07/31/2022   HGB 9.7 (L) 07/31/2022   HCT  33.3 (L) 07/31/2022   MCV 71.6 (L) 07/31/2022   PLT 456 (H) 07/31/2022      Component Value Date/Time   NA 136 07/31/2022 1514   K 3.9 07/31/2022 1514   CL 106 07/31/2022 1514   CO2 22 07/31/2022 1514   GLUCOSE 88 07/31/2022 1514   BUN 9 07/31/2022 1514   CREATININE 0.55 07/31/2022 1514   CALCIUM 8.3 (L) 07/31/2022 1514   PROT 7.4 07/31/2022 1514   ALBUMIN 3.4 (L) 07/31/2022 1514   AST 41 07/31/2022 1514   ALT 32 07/31/2022 1514   ALKPHOS 130 (H) 07/31/2022 1514   BILITOT 0.3 07/31/2022 1514   GFRNONAA >60 07/31/2022 1514   GFRAA >60 04/12/2018 1356   No results found for: "CHOL", "HDL", "LDLCALC", "LDLDIRECT", "TRIG", "CHOLHDL" No results found for: "HGBA1C" No results found for: "VITAMINB12" No results found for: "TSH"   07/31/22 MRI brain [I reviewed images myself and agree with interpretation. -VRP]  1. No acute intracranial abnormality. 2. Mild chronic microvascular ischemic disease for age. 3. Mild-to-moderate inflammatory paranasal sinus disease.   ASSESSMENT AND PLAN  51 y.o. year old female here with:   Dx:  1. Spell of abnormal behavior      PLAN:  ABNORMAL SPELL (slurred speech, disconnected feeling, tremors R > L, lasting 2-3 days) - possibly related to high stress and 1st time delta 8 gummy ingestion - TIA, seizure less likely (given long duration of  symptoms 2+ days; negative imaging) - could consider EEG, carotid u/s, TTE for further evaluation if symptoms return - try to establish with PCP  Return for pending if symptoms worsen or fail to improve, pending test results.    Suanne Marker, MD 08/06/2022, 2:17 PM Certified in Neurology, Neurophysiology and Neuroimaging  [facility] [address] [place], [place] [address] [phone]

## 2022-08-06 NOTE — Patient Instructions (Addendum)
ABNORMAL SPELL (slurred speech, disconnected feeling, tremors R > L, lasting 2-3 days)  - possibly related to high stress and 1st time delta 8 gummy ingestion  - TIA, seizure less likely  - consider EEG, carotid u/s, TTE for further evaluation if symptoms return  - try to establish with PCP
